# Patient Record
Sex: Female | Born: 1935 | Race: White | Hispanic: No | Smoking: Former smoker
Health system: Southern US, Community
[De-identification: ages and names within clinical notes are randomized; demographics above are authoritative.]

## PROBLEM LIST (undated history)

## (undated) DIAGNOSIS — F0390 Unspecified dementia without behavioral disturbance: Secondary | ICD-10-CM

---

## 2020-08-12 ENCOUNTER — Other Ambulatory Visit (HOSPITAL_COMMUNITY)
Admission: RE | Admit: 2020-08-12 | Discharge: 2020-08-12 | Disposition: A | Discharge status: Home / Self Care | Payer: Medicare Other | Source: Skilled Nursing Facility | Attending: Family Medicine | Admitting: Family Medicine

## 2020-08-12 DIAGNOSIS — N390 Urinary tract infection, site not specified: Secondary | ICD-10-CM | POA: Diagnosis present

## 2020-08-12 LAB — URINALYSIS, COMPLETE (UACMP) WITH MICROSCOPIC
Bacteria, UA: NONE SEEN
Bilirubin Urine: NEGATIVE
Glucose, UA: NEGATIVE mg/dL
Hgb urine dipstick: NEGATIVE
Ketones, ur: NEGATIVE mg/dL
Nitrite: NEGATIVE
Protein, ur: NEGATIVE mg/dL
Specific Gravity, Urine: 1.009 (ref 1.005–1.030)
pH: 5 (ref 5.0–8.0)

## 2020-08-13 LAB — URINE CULTURE

## 2021-01-11 ENCOUNTER — Other Ambulatory Visit (HOSPITAL_COMMUNITY)
Admission: RE | Admit: 2021-01-11 | Discharge: 2021-01-11 | Disposition: A | Discharge status: Home / Self Care | Payer: Medicare Other | Source: Other Acute Inpatient Hospital | Attending: Family Medicine | Admitting: Family Medicine

## 2021-01-11 DIAGNOSIS — N390 Urinary tract infection, site not specified: Secondary | ICD-10-CM | POA: Insufficient documentation

## 2021-01-11 LAB — URINALYSIS, ROUTINE W REFLEX MICROSCOPIC
Bilirubin Urine: NEGATIVE
Glucose, UA: NEGATIVE mg/dL
Hgb urine dipstick: NEGATIVE
Ketones, ur: NEGATIVE mg/dL
Nitrite: POSITIVE — AB
Protein, ur: 30 mg/dL — AB
Specific Gravity, Urine: 1.019 (ref 1.005–1.030)
pH: 9 — ABNORMAL HIGH (ref 5.0–8.0)

## 2021-01-13 LAB — URINE CULTURE: Culture: >=100000 — AB

## 2021-05-18 ENCOUNTER — Emergency Department (HOSPITAL_COMMUNITY): Payer: Medicare Other

## 2021-05-18 ENCOUNTER — Encounter (HOSPITAL_COMMUNITY): Payer: Self-pay | Admitting: Emergency Medicine

## 2021-05-18 ENCOUNTER — Observation Stay (HOSPITAL_COMMUNITY): Payer: Medicare Other

## 2021-05-18 ENCOUNTER — Observation Stay (HOSPITAL_COMMUNITY)
Admission: EM | Admit: 2021-05-18 | Discharge: 2021-05-19 | Disposition: A | Discharge status: Home / Self Care | Payer: Medicare Other | Attending: Internal Medicine | Admitting: Internal Medicine

## 2021-05-18 ENCOUNTER — Other Ambulatory Visit: Payer: Self-pay

## 2021-05-18 DIAGNOSIS — S0990XA Unspecified injury of head, initial encounter: Secondary | ICD-10-CM | POA: Diagnosis present

## 2021-05-18 DIAGNOSIS — R296 Repeated falls: Secondary | ICD-10-CM | POA: Diagnosis not present

## 2021-05-18 DIAGNOSIS — H9190 Unspecified hearing loss, unspecified ear: Secondary | ICD-10-CM

## 2021-05-18 DIAGNOSIS — Z87891 Personal history of nicotine dependence: Secondary | ICD-10-CM | POA: Insufficient documentation

## 2021-05-18 DIAGNOSIS — W19XXXA Unspecified fall, initial encounter: Secondary | ICD-10-CM | POA: Diagnosis present

## 2021-05-18 DIAGNOSIS — Z79899 Other long term (current) drug therapy: Secondary | ICD-10-CM | POA: Insufficient documentation

## 2021-05-18 DIAGNOSIS — I639 Cerebral infarction, unspecified: Secondary | ICD-10-CM | POA: Diagnosis present

## 2021-05-18 DIAGNOSIS — S0181XA Laceration without foreign body of other part of head, initial encounter: Principal | ICD-10-CM | POA: Diagnosis present

## 2021-05-18 DIAGNOSIS — J45909 Unspecified asthma, uncomplicated: Secondary | ICD-10-CM | POA: Diagnosis not present

## 2021-05-18 DIAGNOSIS — Z20822 Contact with and (suspected) exposure to covid-19: Secondary | ICD-10-CM | POA: Diagnosis not present

## 2021-05-18 DIAGNOSIS — F0390 Unspecified dementia without behavioral disturbance: Secondary | ICD-10-CM | POA: Diagnosis present

## 2021-05-18 HISTORY — DX: Unspecified dementia, unspecified severity, without behavioral disturbance, psychotic disturbance, mood disturbance, and anxiety: F03.90

## 2021-05-18 LAB — CBC WITH DIFFERENTIAL/PLATELET
Abs Immature Granulocytes: 0.03 10*3/uL (ref 0.00–0.07)
Basophils Absolute: 0.1 10*3/uL (ref 0.0–0.1)
Basophils Relative: 1 %
Eosinophils Absolute: 0.1 10*3/uL (ref 0.0–0.5)
Eosinophils Relative: 1 %
HCT: 39.3 % (ref 36.0–46.0)
Hemoglobin: 12.3 g/dL (ref 12.0–15.0)
Immature Granulocytes: 0 %
Lymphocytes Relative: 23 %
Lymphs Abs: 2.3 10*3/uL (ref 0.7–4.0)
MCH: 28.9 pg (ref 26.0–34.0)
MCHC: 31.3 g/dL (ref 30.0–36.0)
MCV: 92.3 fL (ref 80.0–100.0)
Monocytes Absolute: 0.9 10*3/uL (ref 0.1–1.0)
Monocytes Relative: 8 %
Neutro Abs: 6.9 10*3/uL (ref 1.7–7.7)
Neutrophils Relative %: 67 %
Platelets: 243 10*3/uL (ref 150–400)
RBC: 4.26 MIL/uL (ref 3.87–5.11)
RDW: 13.6 % (ref 11.5–15.5)
WBC: 10.3 10*3/uL (ref 4.0–10.5)
nRBC: 0 % (ref 0.0–0.2)

## 2021-05-18 LAB — URINALYSIS, ROUTINE W REFLEX MICROSCOPIC
Bilirubin Urine: NEGATIVE
Glucose, UA: NEGATIVE mg/dL
Hgb urine dipstick: NEGATIVE
Ketones, ur: NEGATIVE mg/dL
Leukocytes,Ua: NEGATIVE
Nitrite: NEGATIVE
Protein, ur: NEGATIVE mg/dL
Specific Gravity, Urine: 1.008 (ref 1.005–1.030)
pH: 7 (ref 5.0–8.0)

## 2021-05-18 LAB — RESP PANEL BY RT-PCR (FLU A&B, COVID) ARPGX2
Influenza A by PCR: NEGATIVE
Influenza B by PCR: NEGATIVE
SARS Coronavirus 2 by RT PCR: NEGATIVE

## 2021-05-18 LAB — COMPREHENSIVE METABOLIC PANEL
ALT: 21 U/L (ref 0–44)
AST: 20 U/L (ref 15–41)
Albumin: 3.7 g/dL (ref 3.5–5.0)
Alkaline Phosphatase: 68 U/L (ref 38–126)
Anion gap: 7 (ref 5–15)
BUN: 23 mg/dL (ref 8–23)
CO2: 27 mmol/L (ref 22–32)
Calcium: 8.8 mg/dL — ABNORMAL LOW (ref 8.9–10.3)
Chloride: 103 mmol/L (ref 98–111)
Creatinine, Ser: 1.09 mg/dL — ABNORMAL HIGH (ref 0.44–1.00)
GFR, Estimated: 47 mL/min — ABNORMAL LOW (ref >60)
Glucose, Bld: 106 mg/dL — ABNORMAL HIGH (ref 70–99)
Potassium: 4.2 mmol/L (ref 3.5–5.1)
Sodium: 137 mmol/L (ref 135–145)
Total Bilirubin: 0.5 mg/dL (ref 0.3–1.2)
Total Protein: 7.3 g/dL (ref 6.5–8.1)

## 2021-05-18 IMAGING — DX DG PELVIS 1-2V
2 series · 2 of 2 positions shown · non-contrast
Comparison: None.

CLINICAL DATA: Fall.

EXAM:
PELVIS - 1-2 VIEW

[pelvis ap (1 of 2)]
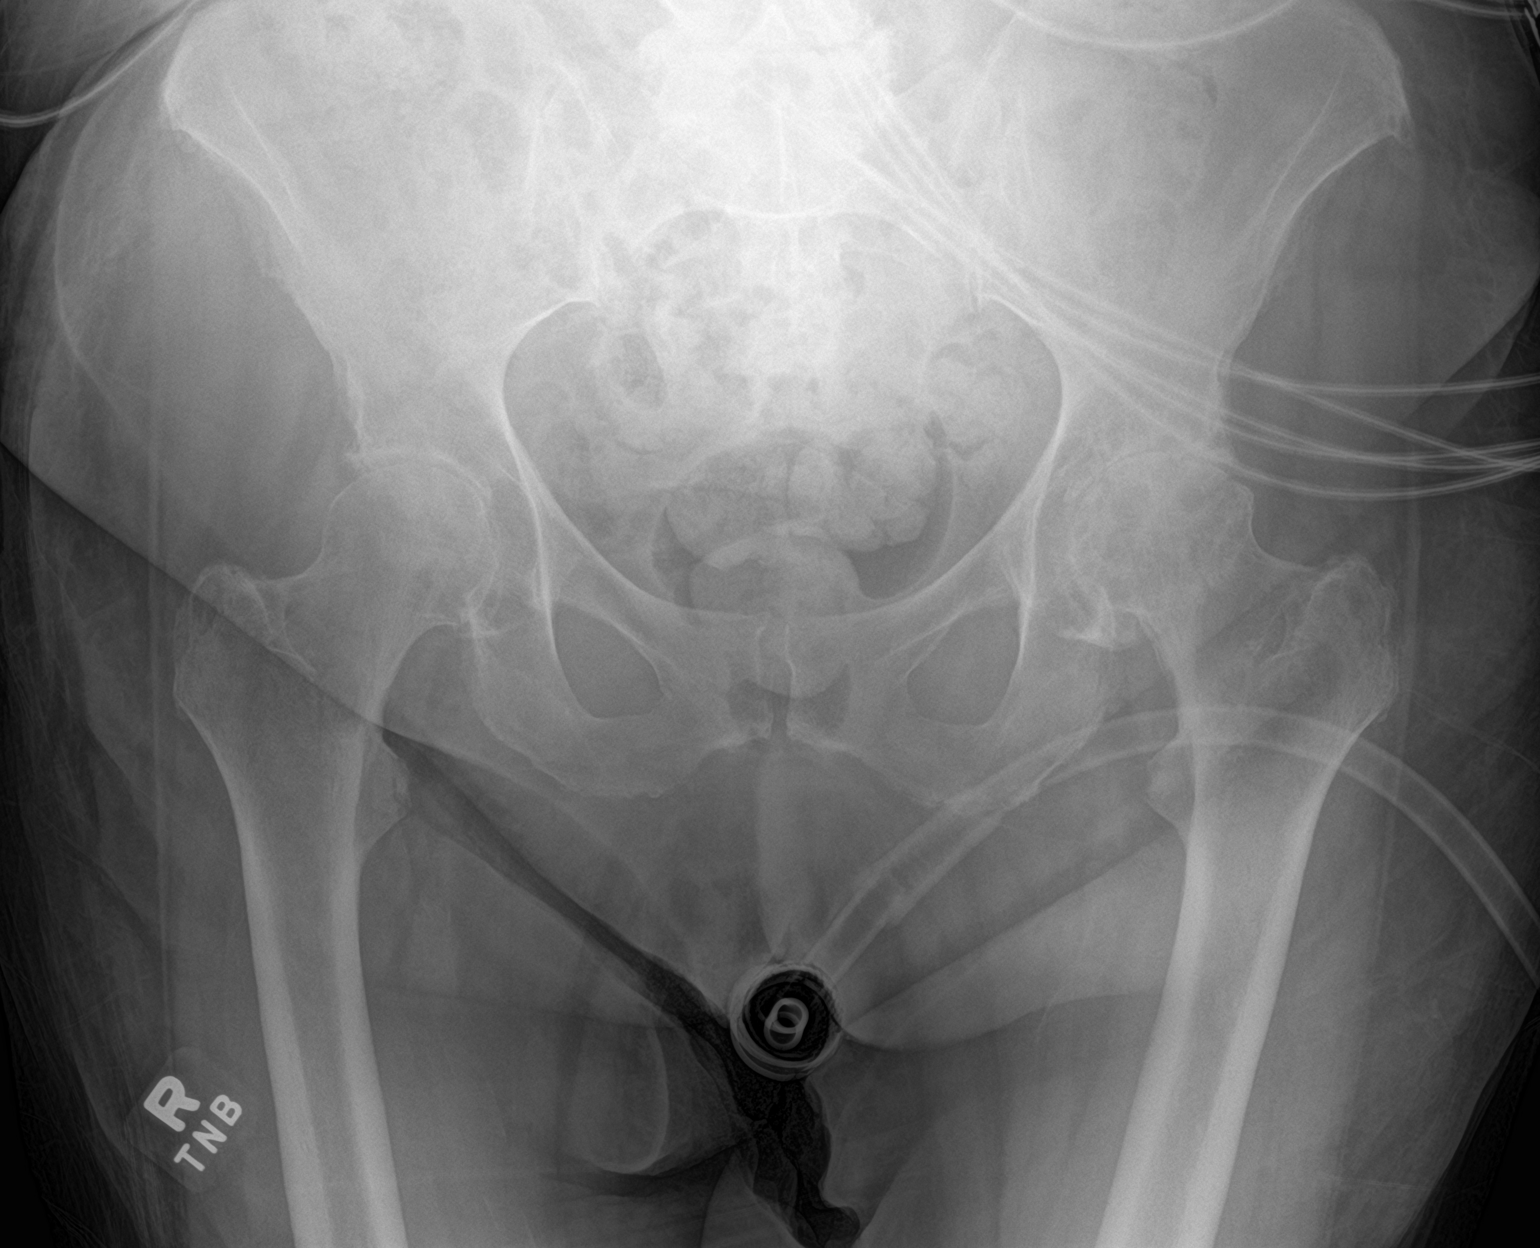

[pelvis ap (2 of 2)]
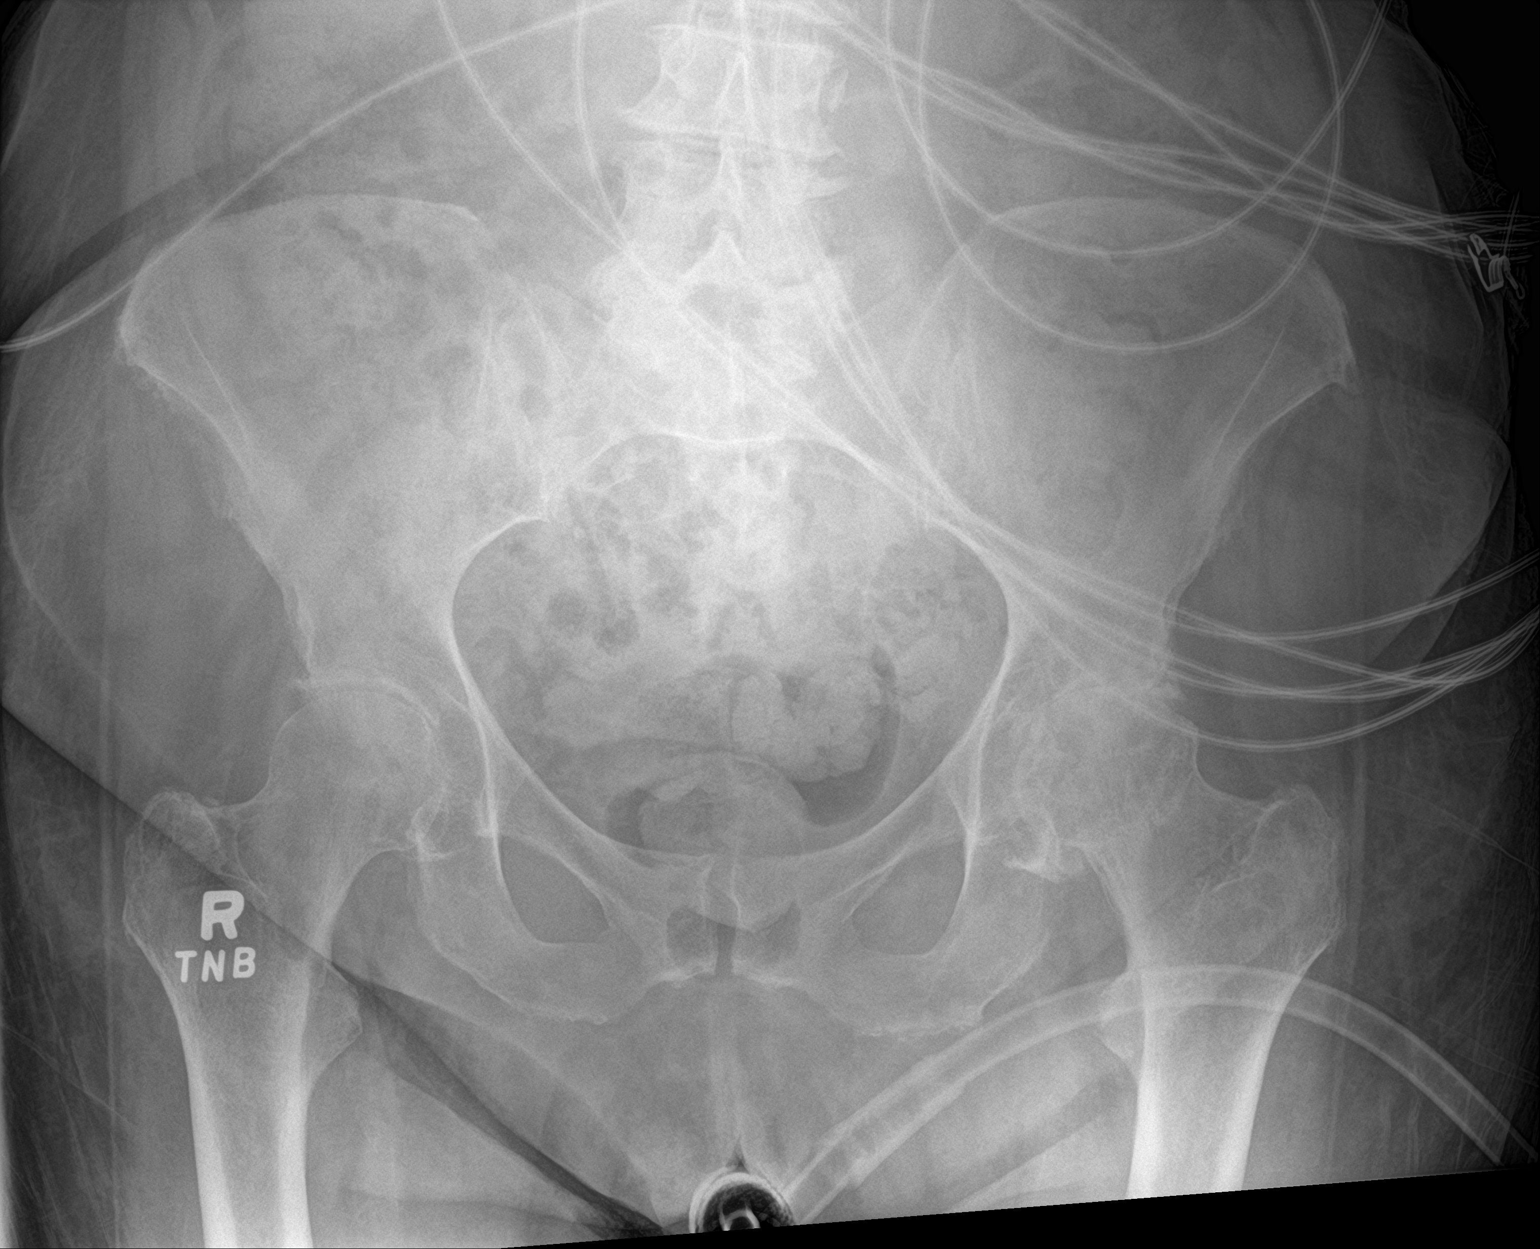

[2 of 2 positions shown; findings below may reference images not displayed]

FINDINGS: There is no evidence of pelvic fracture or diastasis. No pelvic bone
lesions are seen. Moderate to severe left and mild-to-moderate right
hip osteoarthritis.
IMPRESSION: 1. No acute osseous abnormality.

## 2021-05-18 IMAGING — MR MR MRA HEAD W/O CM
1 series · 48 of 48 positions shown · non-contrast
Comparison: CT head/maxillofacial performed earlier today
[DATE].

CLINICAL DATA: Neuro deficit, acute, stroke suspected. Additional
history provided: Fall, forehead laceration, altered mental status.

EXAM:
MRI HEAD WITHOUT CONTRAST
MRA HEAD WITHOUT CONTRAST
TECHNIQUE: Multiplanar, multi-echo pulse sequences of the brain and surrounding
structures were acquired without intravenous contrast. Angiographic
images of the Circle of Willis were acquired using MRA technique
without intravenous contrast.

[Series 100: TOF · axial · 0.5mm · 0.76mm/px · z∈[-53,+24]mm · 48 of 168 slices shown]
[im 1/168]
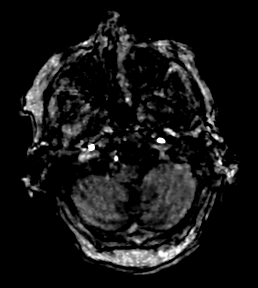
[im 4/168]
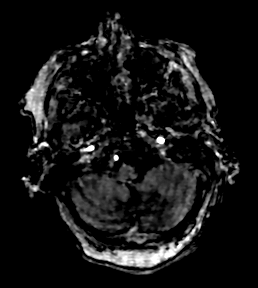
[im 8/168]
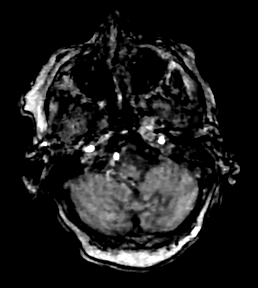
[im 11/168]
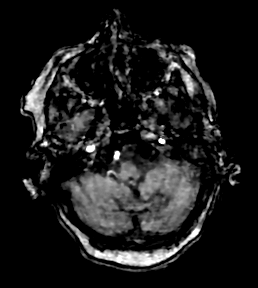
[im 15/168]
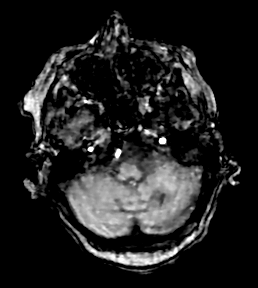
[im 18/168]
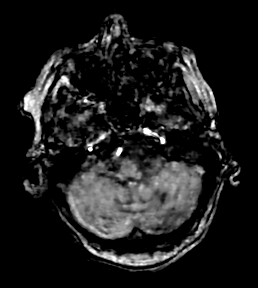
[im 22/168]
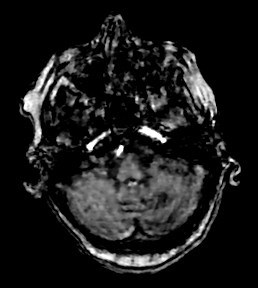
[im 25/168]
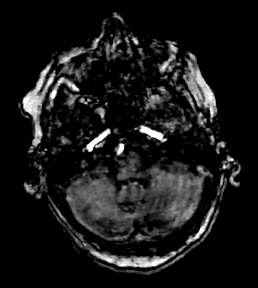
[im 29/168]
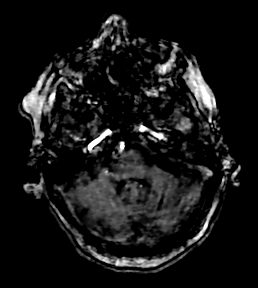
[im 32/168]
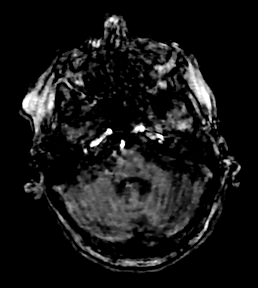
[im 36/168]
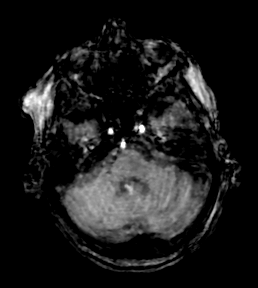
[im 40/168]
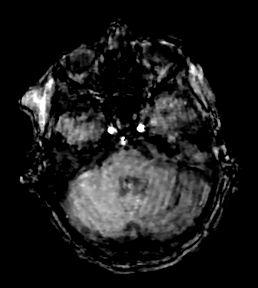
[im 43/168]
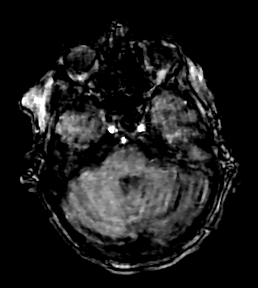
[im 47/168]
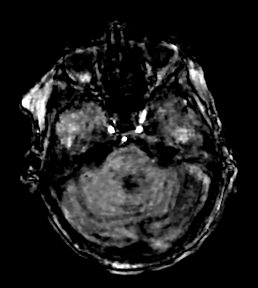
[im 50/168]
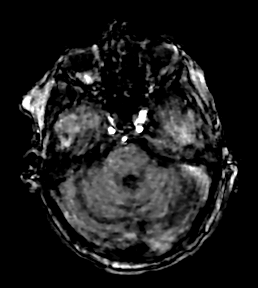
[im 54/168]
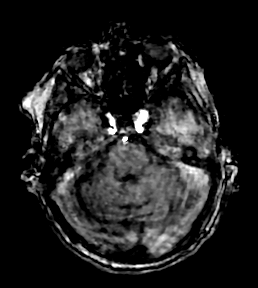
[im 57/168]
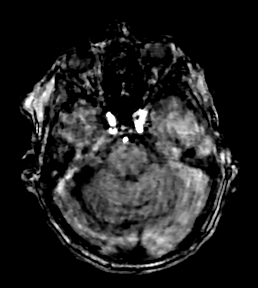
[im 61/168]
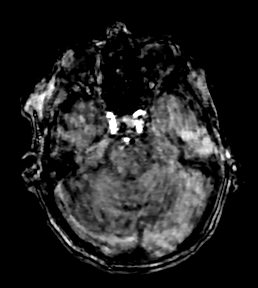
[im 64/168]
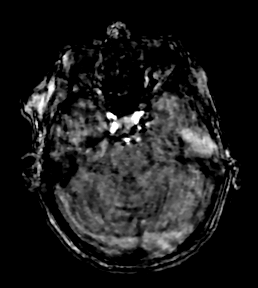
[im 68/168]
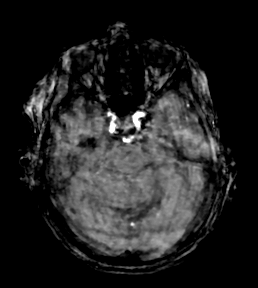
[im 72/168]
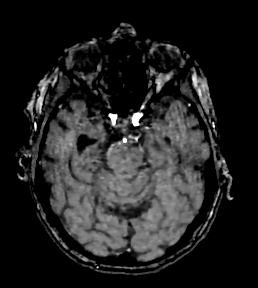
[im 75/168]
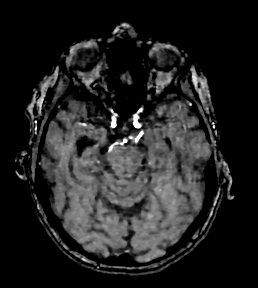
[im 79/168]
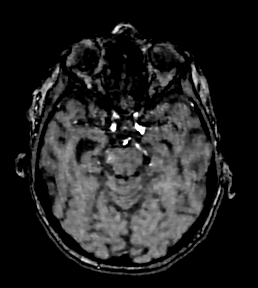
[im 82/168]
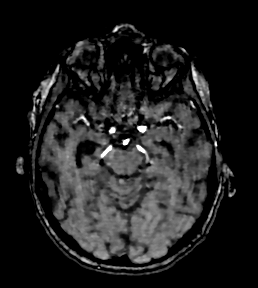
[im 86/168]
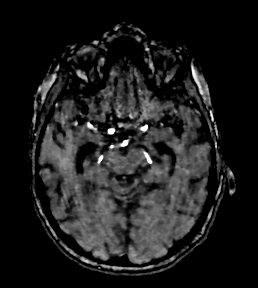
[im 89/168]
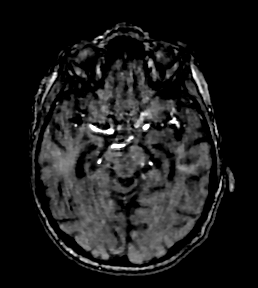
[im 93/168]
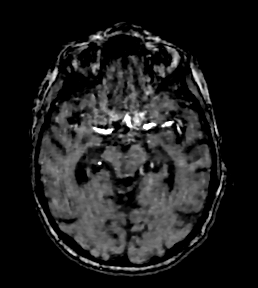
[im 96/168]
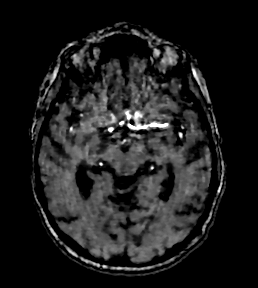
[im 100/168]
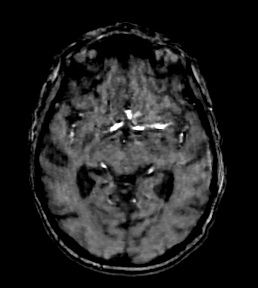
[im 104/168]
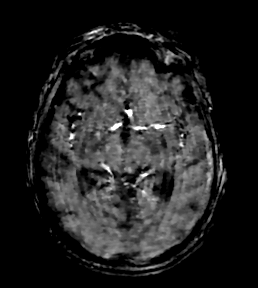
[im 107/168]
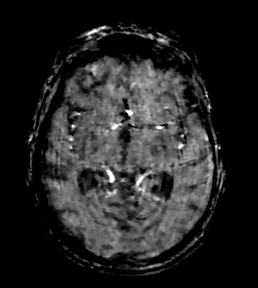
[im 111/168]
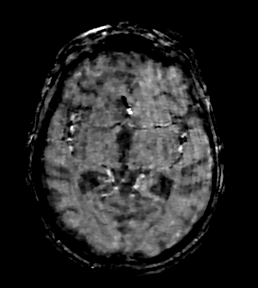
[im 114/168]
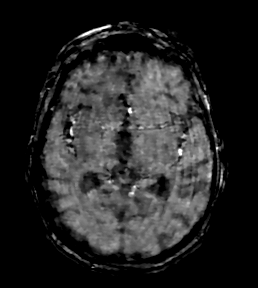
[im 118/168]
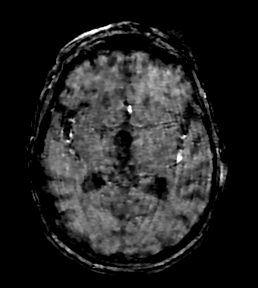
[im 121/168]
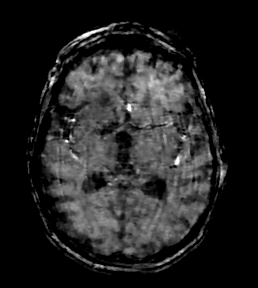
[im 125/168]
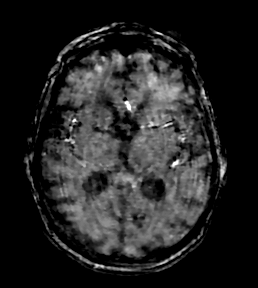
[im 128/168]
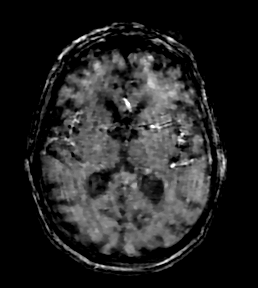
[im 132/168]
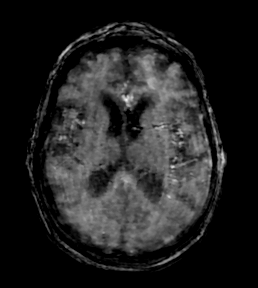
[im 136/168]
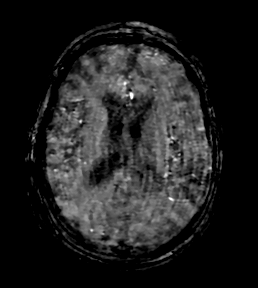
[im 139/168]
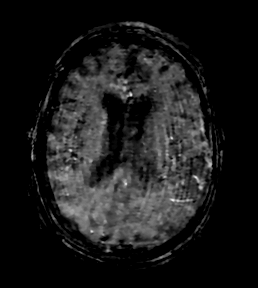
[im 143/168]
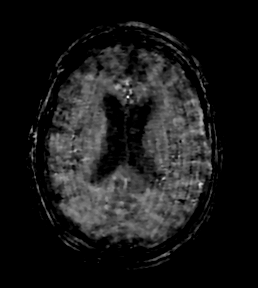
[im 146/168]
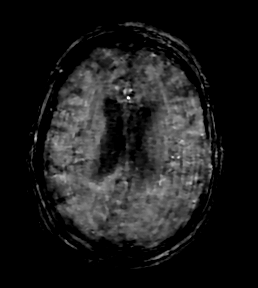
[im 150/168]
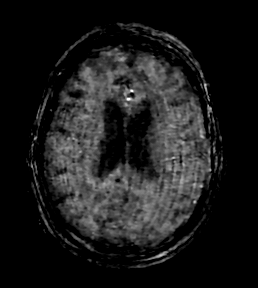
[im 153/168]
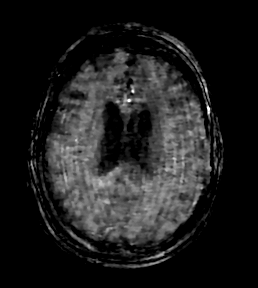
[im 157/168]
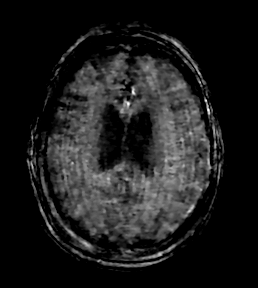
[im 160/168]
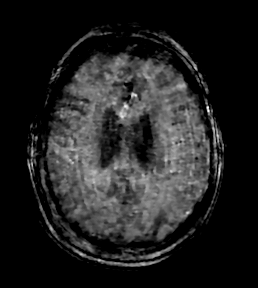
[im 164/168]
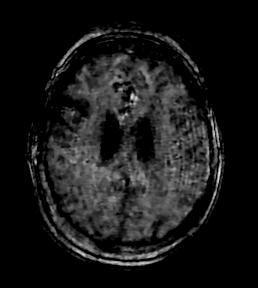
[im 168/168]
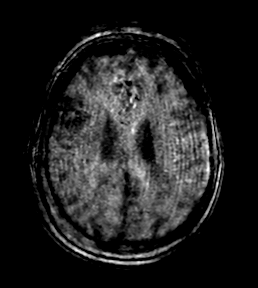

[48 of 48 positions shown; findings below may reference images not displayed]

FINDINGS: MRI HEAD FINDINGS

Brain:

The patient was unable to tolerate the full examination. As a
result, only axial and coronal diffusion-weighted sequences, a
sagittal T1 weighted sequence, axial T2 TSE sequence and an axial T2
GRE sequence could be obtained. The acquired imaging is motion
degraded. Most notably, there is moderate motion degradation of the
coronal diffusion-weighted sequence, severe motion degradation of
the sagittal T1 weighted sequence, severe motion degradation of the
axial T2 TSE sequence and severe motion degradation of the axial T2
GRE sequence.

Mild generalized cerebral and cerebellar atrophy.

There is a focus of cortical/subcortical diffusion weighted signal
abnormality within the right superior frontal gyrus measuring 3.8 x
0.9 cm in transaxial dimensions. There are some regions of
corresponding hypointense signal on the ADC map, and this finding is
consistent with an acute/subacute infarct.

Incompletely assessed multifocal T2/FLAIR hyperintensity within the
cerebral white matter and pons, nonspecific but compatible with
chronic small vessel ischemic disease.

Chronic lacunar infarct within the right thalamus.

Chronic infarcts within the bilateral cerebellar hemispheres.

Within the limitations of motion degradation, no intracranial mass,
chronic intracranial blood products or extra-axial fluid collection
is identified. No midline shift.

Vascular: Flow voids poorly assessed due to the degree of motion
degradation.

Skull and upper cervical spine: Within limitations of motion
degradation, no focal suspicious marrow lesion is identified.

Sinuses/Orbits: Within limitations of motion degradation, no acute
orbital abnormality or significant paranasal sinus disease is
identified.

MRA HEAD FINDINGS

The examination is moderate to severely motion degraded. This
significantly limits evaluation for vessel occlusions. This also
precludes adequate evaluation for intracranial arterial stenoses and
for aneurysms.

Anterior circulation:

The intracranial internal carotid arteries are patent. The M1 middle
cerebral arteries are patent. No M2 proximal branch occlusion is
identified. The anterior cerebral arteries are patent proximally.
However, motion degradation precludes adequate evaluation of the
anterior cerebral arteries beginning at the A2/A3 junction.

Posterior circulation:

The intracranial left vertebral artery is poorly delineated, and
patency of this vessel cannot be established. The intracranial right
vertebral artery is patent. The basilar artery is patent. The
posterior cerebral arteries are patent proximally. Atherosclerotic
irregularity of both vessels. Most notably, there are sites of
moderate stenosis within the P2 left PCA. Motion degradation
precludes adequate evaluation of the posterior cerebral arteries
beyond the P3 segment level. A left posterior communicating artery
is present. The right posterior communicating artery is hypoplastic
or absent.

Anatomic variants: As described
IMPRESSION: MRI brain:

1. Prematurely terminated, significantly motion degraded and limited
examination as described.
2. 3.8 x 0.9 cm acute/subacute cortical and subcortical infarct
within the right superior frontal gyrus (right ACA vascular
territory).
3. Chronic right thalamic lacunar infarct.
4. Incompletely assessed chronic small vessel ischemic disease
within the cerebral white matter and pons.
5. Chronic infarcts within the bilateral cerebellar hemispheres.
6. Mild generalized parenchymal atrophy.

MRA head:

1. The examination is moderate to severely motion degraded. This
significantly limits evaluation for vessel occlusions. This also
precludes adequate evaluation for intracranial arterial stenoses and
aneurysms.
2. No definite anterior circulation proximal large vessel occlusion
is identified. However, motion degradation precludes adequate
evaluation of the anterior cerebral arteries beginning at the A2/A3
junction. CT angiography may be obtained for further evaluation, as
clinically warranted.
3. The intracranial left vertebral artery is poorly delineated, and
patency of this vessel cannot be established.
4. Sites of moderate stenosis within the P2 left PCA.

## 2021-05-18 IMAGING — MR MR HEAD W/O CM
7 series · 48 of 48 positions shown · non-contrast
Comparison: CT head/maxillofacial performed earlier today
[DATE].

CLINICAL DATA: Neuro deficit, acute, stroke suspected. Additional
history provided: Fall, forehead laceration, altered mental status.

EXAM:
MRI HEAD WITHOUT CONTRAST
MRA HEAD WITHOUT CONTRAST
TECHNIQUE: Multiplanar, multi-echo pulse sequences of the brain and surrounding
structures were acquired without intravenous contrast. Angiographic
images of the Circle of Willis were acquired using MRA technique
without intravenous contrast.

[Series 5: DWI · axial · 4.0mm · 0.88mm/px · z∈[-52,+86]mm · 9 of 36 slices shown (1 of 4)]
[im 1/36]
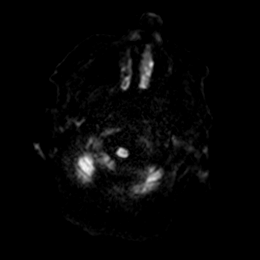
[im 5/36]
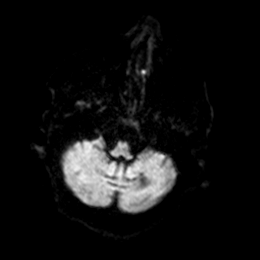
[im 9/36]
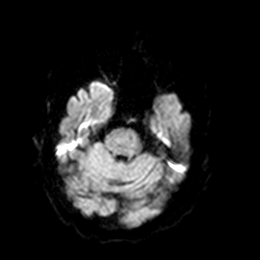
[im 14/36]
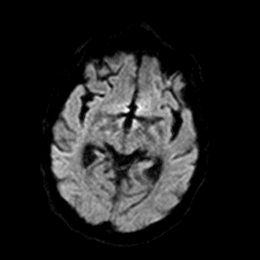
[im 18/36]
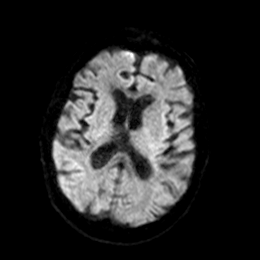
[im 22/36]
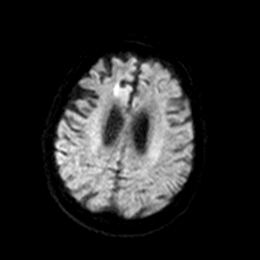
[im 27/36]
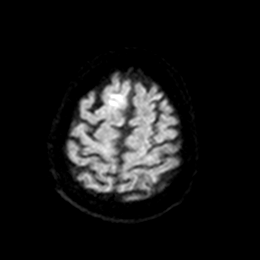
[im 31/36]
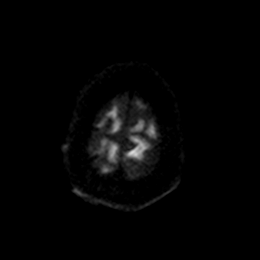
[im 36/36]
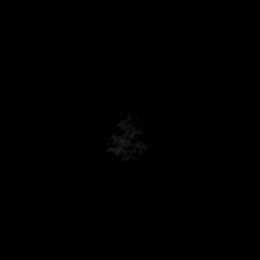

[Series 6: DWI · axial · 4.0mm · 0.88mm/px · z∈[-52,+86]mm · 9 of 36 slices shown (2 of 4)]
[im 1/36]
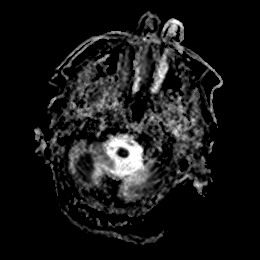
[im 5/36]
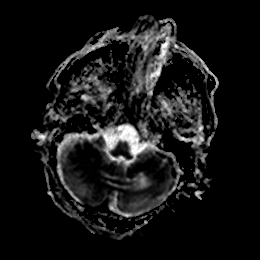
[im 9/36]
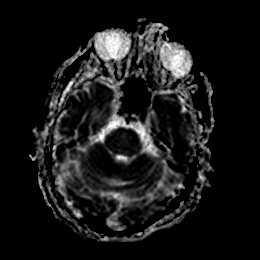
[im 14/36]
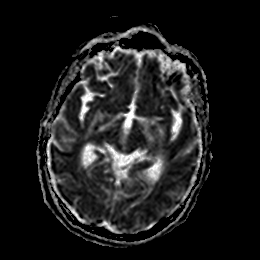
[im 18/36]
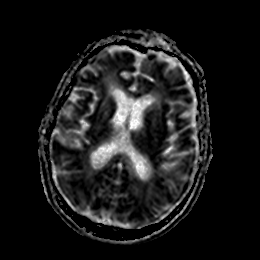
[im 22/36]
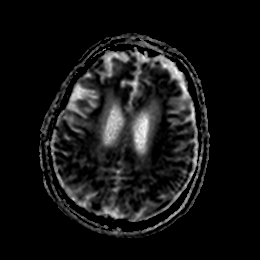
[im 27/36]
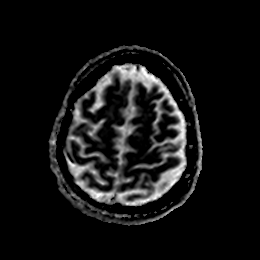
[im 31/36]
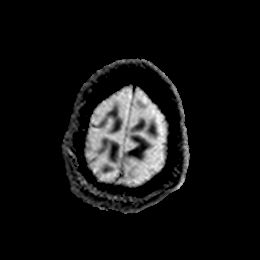
[im 36/36]
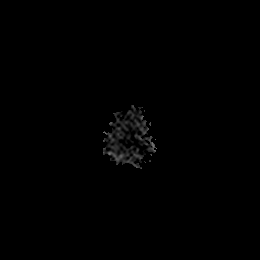

[Series 7: DWI · coronal · 4.0mm · 0.88mm/px · 7 of 32 slices shown (3 of 4)]
[im 1/32]
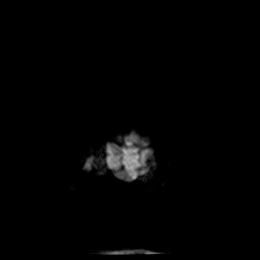
[im 6/32]
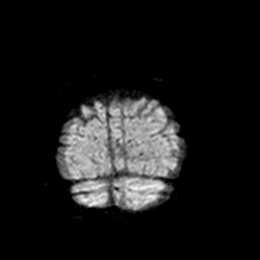
[im 11/32]
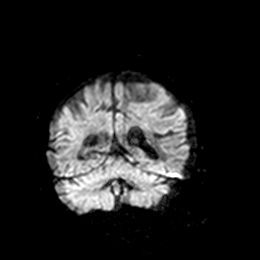
[im 16/32]
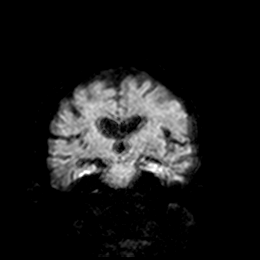
[im 21/32]
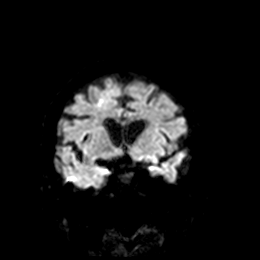
[im 26/32]
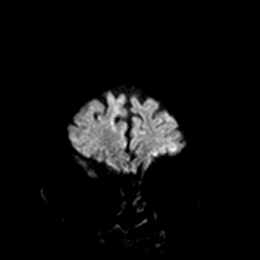
[im 32/32]
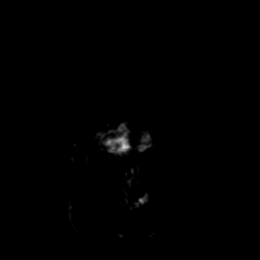

[Series 8: DWI · coronal · 4.0mm · 0.88mm/px · 7 of 32 slices shown (4 of 4)]
[im 1/32]
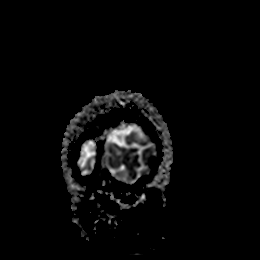
[im 6/32]
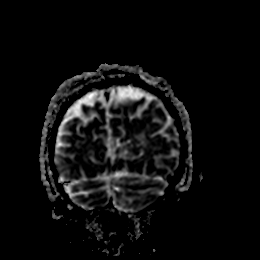
[im 11/32]
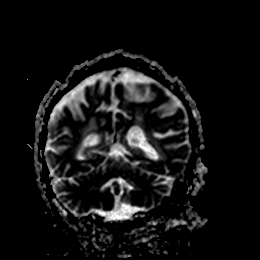
[im 16/32]
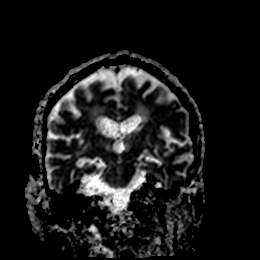
[im 21/32]
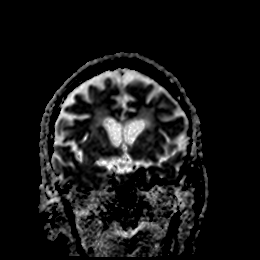
[im 26/32]
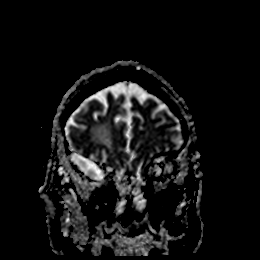
[im 32/32]
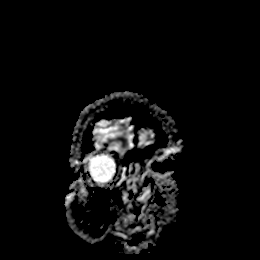

[Series 14: T1 · sagittal · 5.0mm · 0.80mm/px · 5 of 23 slices shown]
[im 1/23]
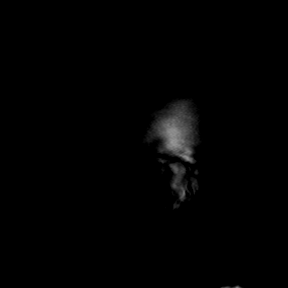
[im 6/23]
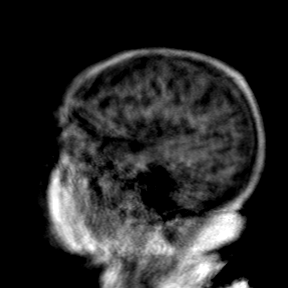
[im 12/23]
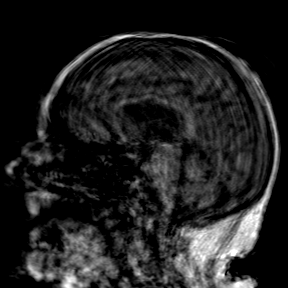
[im 17/23]
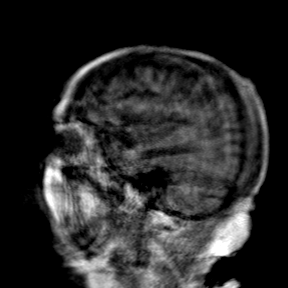
[im 23/23]
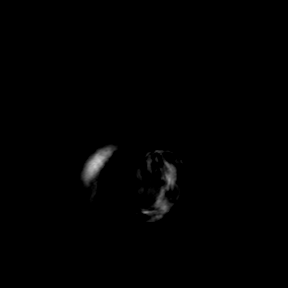

[Series 15: T2 · axial · 5.0mm · 0.72mm/px · z∈[-59,+93]mm · 5 of 23 slices shown]
[im 1/23]
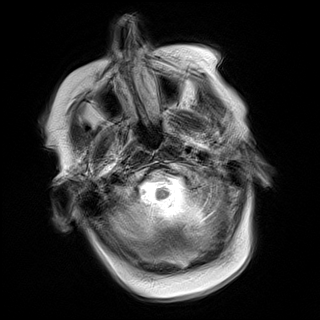
[im 6/23]
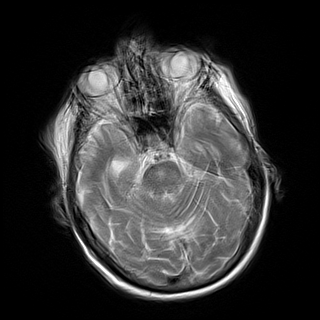
[im 12/23]
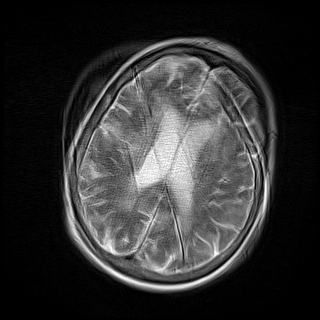
[im 17/23]
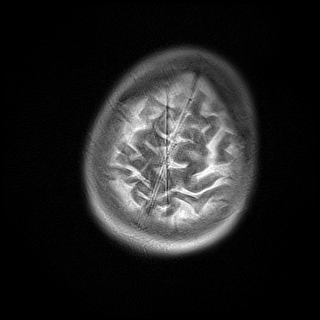
[im 23/23]
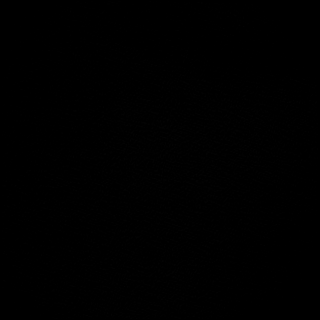

[Series 16: ax hemo · axial · 5.0mm · 0.86mm/px · z∈[-57,+85]mm · 6 of 25 slices shown]
[im 1/25]
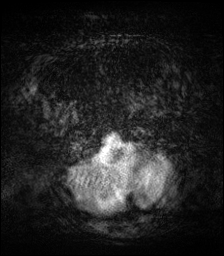
[im 5/25]
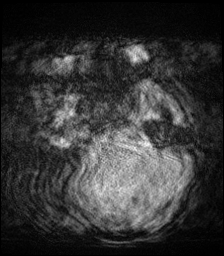
[im 10/25]
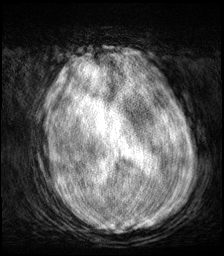
[im 15/25]
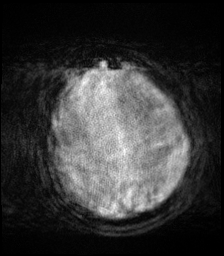
[im 20/25]
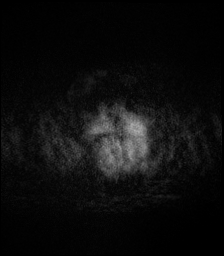
[im 25/25]
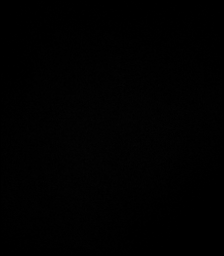

[48 of 48 positions shown; findings below may reference images not displayed]

FINDINGS: MRI HEAD FINDINGS

Brain:

The patient was unable to tolerate the full examination. As a
result, only axial and coronal diffusion-weighted sequences, a
sagittal T1 weighted sequence, axial T2 TSE sequence and an axial T2
GRE sequence could be obtained. The acquired imaging is motion
degraded. Most notably, there is moderate motion degradation of the
coronal diffusion-weighted sequence, severe motion degradation of
the sagittal T1 weighted sequence, severe motion degradation of the
axial T2 TSE sequence and severe motion degradation of the axial T2
GRE sequence.

Mild generalized cerebral and cerebellar atrophy.

There is a focus of cortical/subcortical diffusion weighted signal
abnormality within the right superior frontal gyrus measuring 3.8 x
0.9 cm in transaxial dimensions. There are some regions of
corresponding hypointense signal on the ADC map, and this finding is
consistent with an acute/subacute infarct.

Incompletely assessed multifocal T2/FLAIR hyperintensity within the
cerebral white matter and pons, nonspecific but compatible with
chronic small vessel ischemic disease.

Chronic lacunar infarct within the right thalamus.

Chronic infarcts within the bilateral cerebellar hemispheres.

Within the limitations of motion degradation, no intracranial mass,
chronic intracranial blood products or extra-axial fluid collection
is identified. No midline shift.

Vascular: Flow voids poorly assessed due to the degree of motion
degradation.

Skull and upper cervical spine: Within limitations of motion
degradation, no focal suspicious marrow lesion is identified.

Sinuses/Orbits: Within limitations of motion degradation, no acute
orbital abnormality or significant paranasal sinus disease is
identified.

MRA HEAD FINDINGS

The examination is moderate to severely motion degraded. This
significantly limits evaluation for vessel occlusions. This also
precludes adequate evaluation for intracranial arterial stenoses and
for aneurysms.

Anterior circulation:

The intracranial internal carotid arteries are patent. The M1 middle
cerebral arteries are patent. No M2 proximal branch occlusion is
identified. The anterior cerebral arteries are patent proximally.
However, motion degradation precludes adequate evaluation of the
anterior cerebral arteries beginning at the A2/A3 junction.

Posterior circulation:

The intracranial left vertebral artery is poorly delineated, and
patency of this vessel cannot be established. The intracranial right
vertebral artery is patent. The basilar artery is patent. The
posterior cerebral arteries are patent proximally. Atherosclerotic
irregularity of both vessels. Most notably, there are sites of
moderate stenosis within the P2 left PCA. Motion degradation
precludes adequate evaluation of the posterior cerebral arteries
beyond the P3 segment level. A left posterior communicating artery
is present. The right posterior communicating artery is hypoplastic
or absent.

Anatomic variants: As described
IMPRESSION: MRI brain:

1. Prematurely terminated, significantly motion degraded and limited
examination as described.
2. 3.8 x 0.9 cm acute/subacute cortical and subcortical infarct
within the right superior frontal gyrus (right ACA vascular
territory).
3. Chronic right thalamic lacunar infarct.
4. Incompletely assessed chronic small vessel ischemic disease
within the cerebral white matter and pons.
5. Chronic infarcts within the bilateral cerebellar hemispheres.
6. Mild generalized parenchymal atrophy.

MRA head:

1. The examination is moderate to severely motion degraded. This
significantly limits evaluation for vessel occlusions. This also
precludes adequate evaluation for intracranial arterial stenoses and
aneurysms.
2. No definite anterior circulation proximal large vessel occlusion
is identified. However, motion degradation precludes adequate
evaluation of the anterior cerebral arteries beginning at the A2/A3
junction. CT angiography may be obtained for further evaluation, as
clinically warranted.
3. The intracranial left vertebral artery is poorly delineated, and
patency of this vessel cannot be established.
4. Sites of moderate stenosis within the P2 left PCA.

## 2021-05-18 IMAGING — CT CT MAXILLOFACIAL W/O CM
1 series · 15 of 30 positions shown, 19 images · non-contrast
Comparison: None.

CLINICAL DATA: Fall.

EXAM:
CT HEAD WITHOUT CONTRAST
CT MAXILLOFACIAL WITHOUT CONTRAST
CT CERVICAL SPINE WITHOUT CONTRAST
TECHNIQUE: Multidetector CT imaging of the head, cervical spine, and
maxillofacial structures were performed using the standard protocol
without intravenous contrast. Multiplanar CT image reconstructions
of the cervical spine and maxillofacial structures were also
generated.

[Series 2: max soft · axial · 0.35mm/px · z∈[-17,+113]mm · 15 of 71 slices shown, 19 images]
[im 3/71  brain]
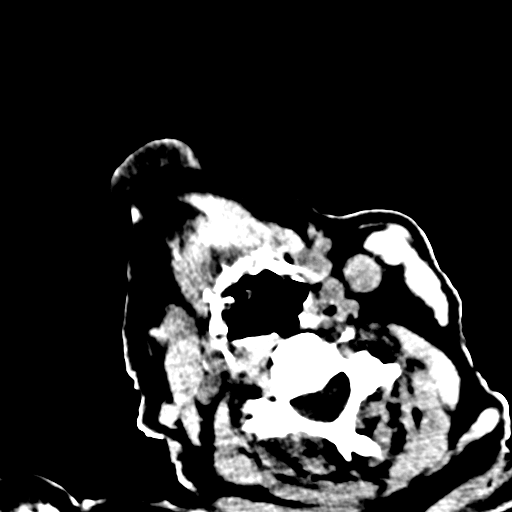
[im 3/71  bone]
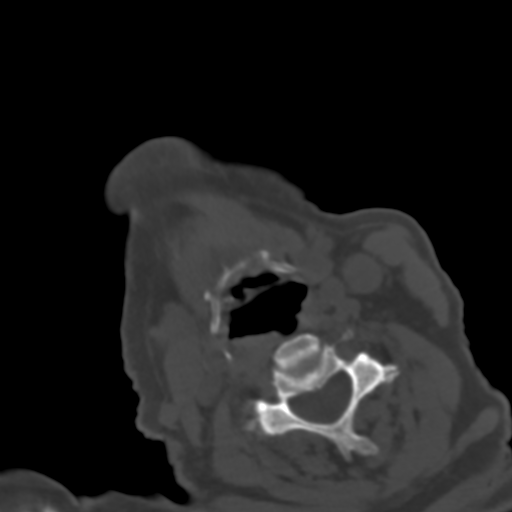
[im 8/71  bone]
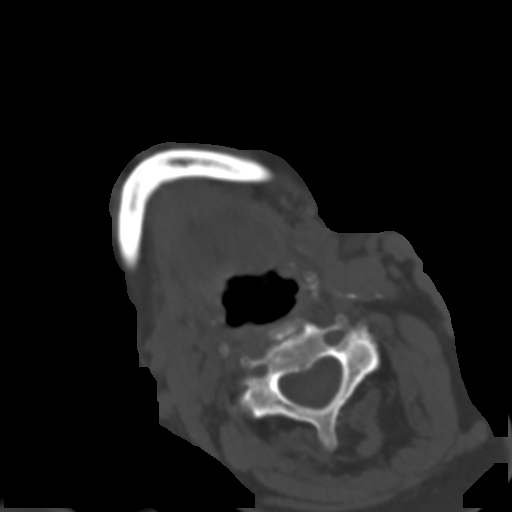
[im 13/71  bone]
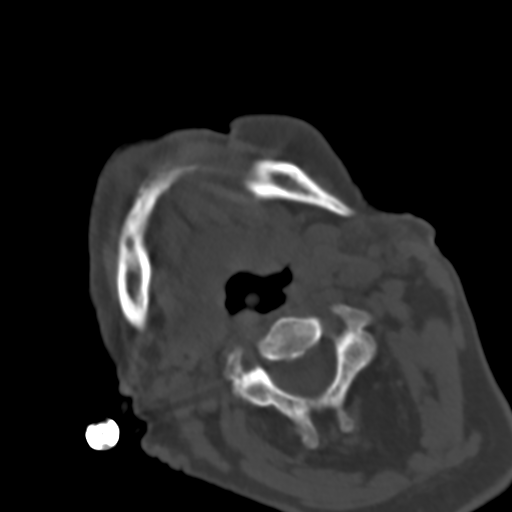
[im 17/71  bone]
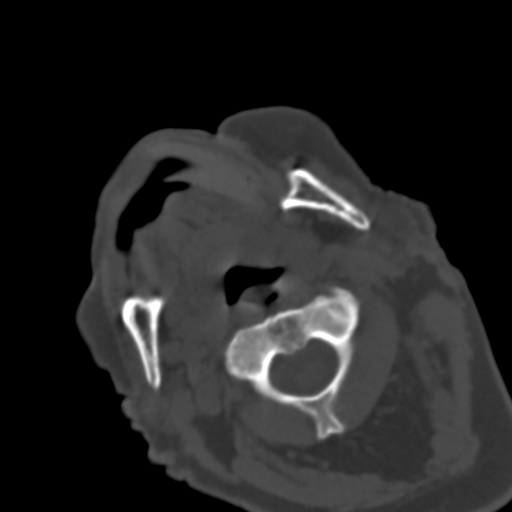
[im 22/71  brain]
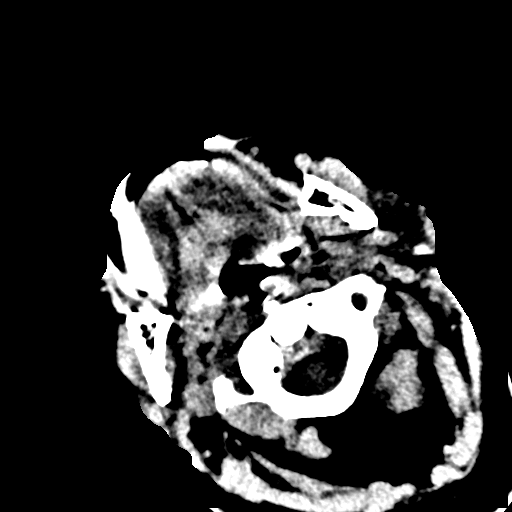
[im 22/71  bone]
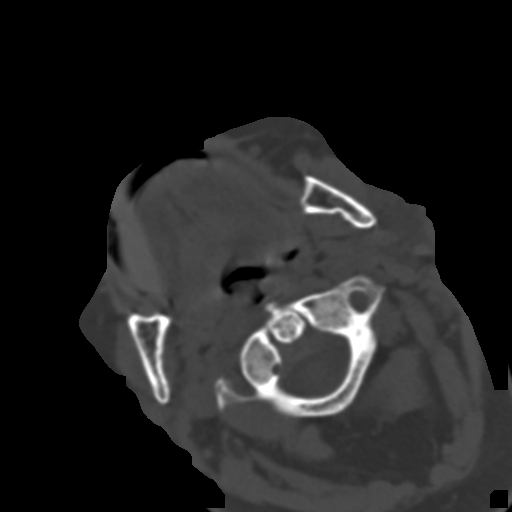
[im 27/71  bone]
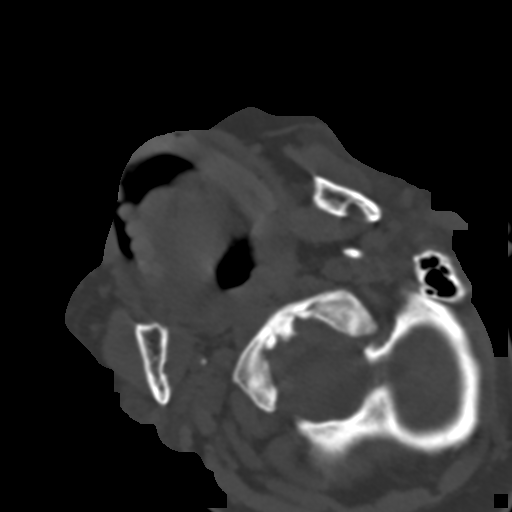
[im 32/71  bone]
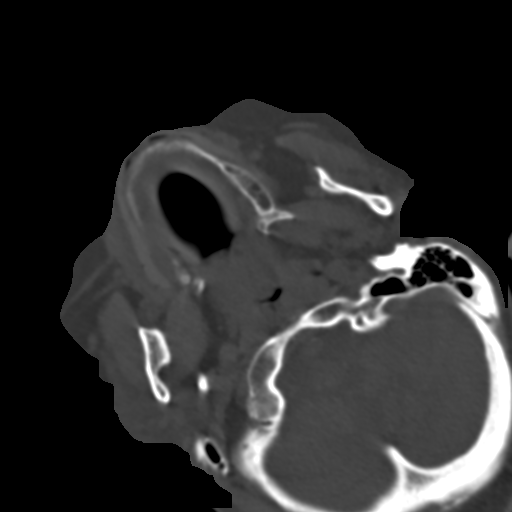
[im 37/71  bone]
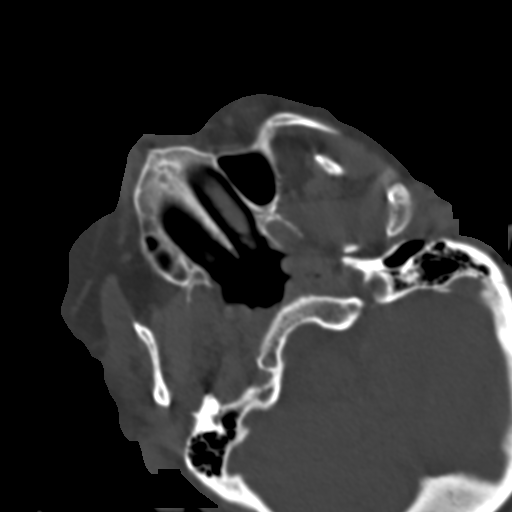
[im 39/71  brain]
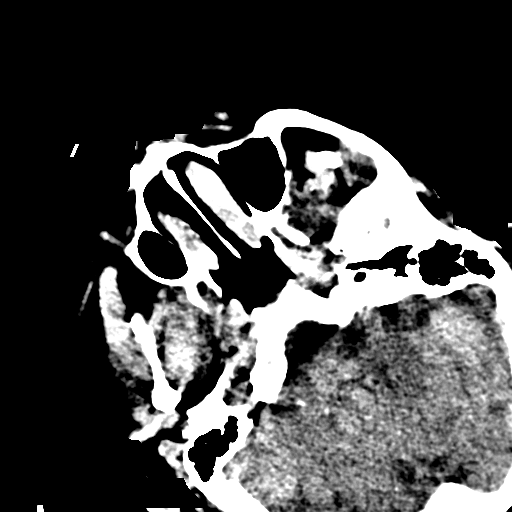
[im 39/71  bone]
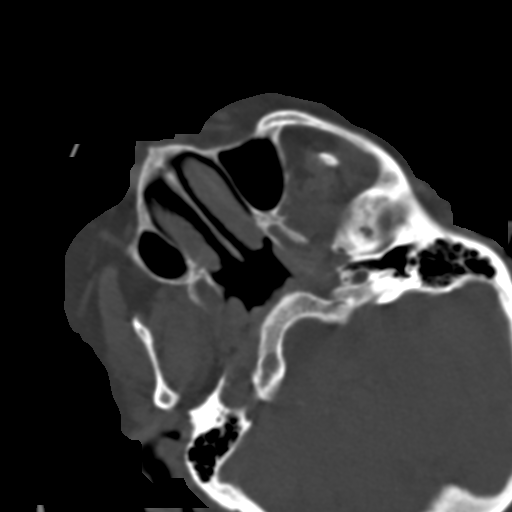
[im 44/71  bone]
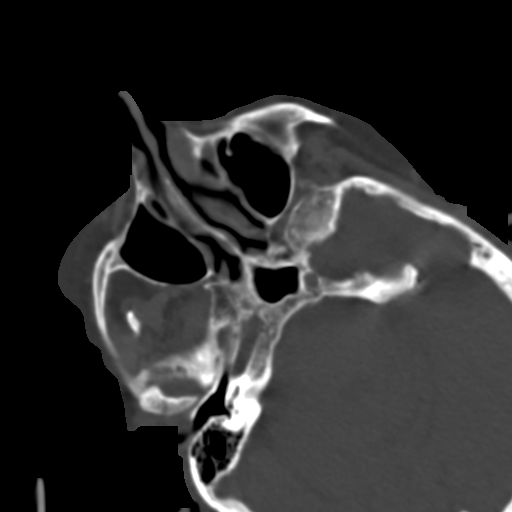
[im 49/71  bone]
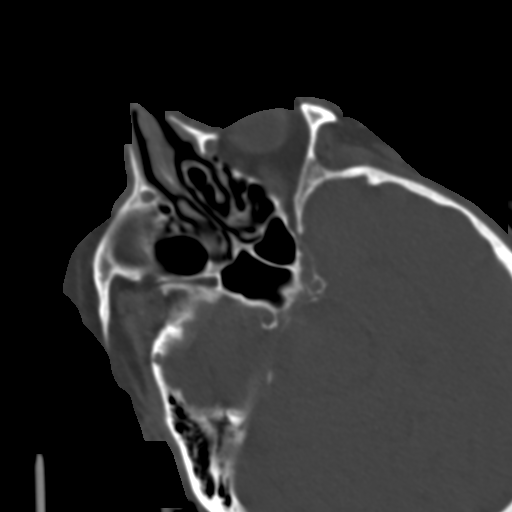
[im 54/71  bone]
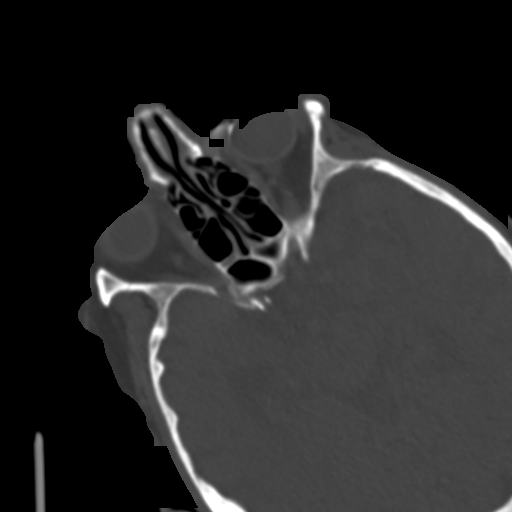
[im 58/71  brain]
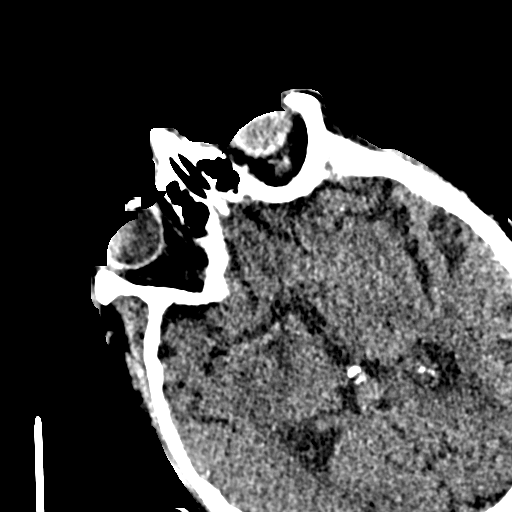
[im 58/71  bone]
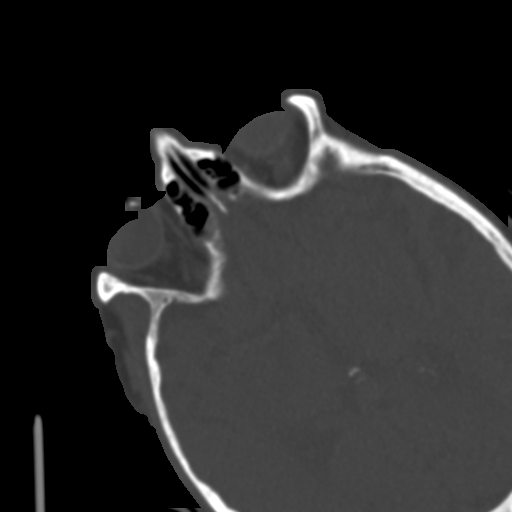
[im 63/71  bone]
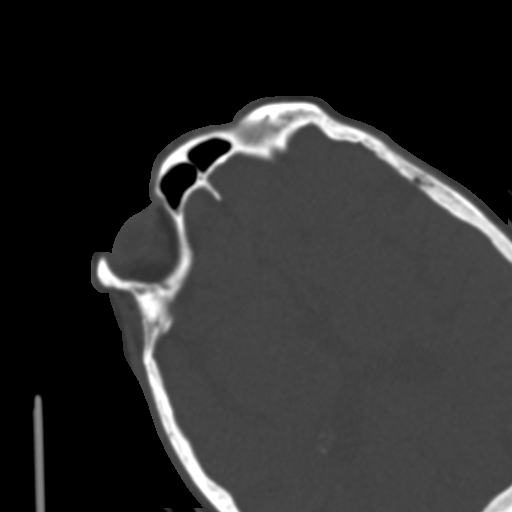
[im 68/71  bone]
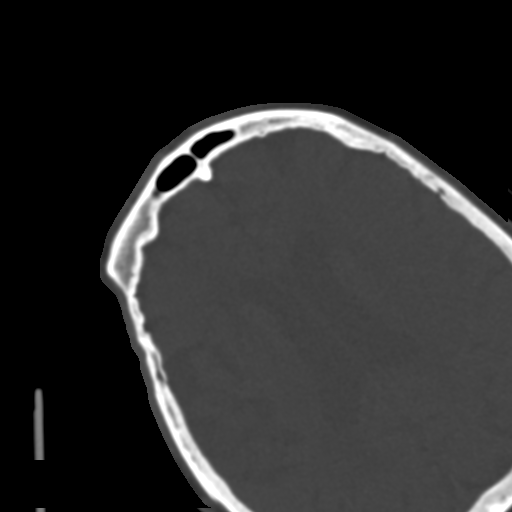

[15 of 30 positions shown; findings below may reference images not displayed]

FINDINGS: CT HEAD FINDINGS

Brain: Loss of the normal gray-white matter differentiation in the
right superior frontal gyrus. No evidence of hemorrhage,
hydrocephalus, extra-axial collection or mass lesion/mass effect.
Old left cerebellar and right thalamus infarcts. Moderate cerebral
atrophy and chronic microvascular ischemic changes.

Vascular: Calcified atherosclerosis at the skullbase. No hyperdense
vessel.

Skull: Normal. Negative for fracture or focal lesion.

Other: None.

CT MAXILLOFACIAL FINDINGS

Osseous: No fracture or mandibular dislocation. No destructive
process.

Orbits: Negative. No traumatic or inflammatory finding.

Sinuses: Clear.

Soft tissues: Negative.

CT CERVICAL SPINE FINDINGS

Alignment: No traumatic malalignment. Mild facet mediated
anterolisthesis at C3-C4 and C7-T1.

Skull base and vertebrae: No acute fracture. No primary bone lesion
or focal pathologic process.

Soft tissues and spinal canal: No prevertebral fluid or swelling. No
visible canal hematoma.

Disc levels: Advanced multilevel disc height loss and facet
uncovertebral hypertrophy.

Upper chest: Biapical pleuroparenchymal scarring.

Other: None.
IMPRESSION: 1. Loss of the normal gray-white matter differentiation in the right
superior frontal gyrus, concerning for acute to subacute infarct. No
acute intracranial hemorrhage.
2. Old left cerebellar and right thalamus infarcts.
3. No acute maxillofacial fracture.
4. No acute cervical spine fracture or traumatic listhesis. Advanced
multilevel degenerative changes.

## 2021-05-18 IMAGING — CT CT CERVICAL SPINE W/O CM
3 series · 13 of 33 positions shown, 16 images · non-contrast
Comparison: None.

CLINICAL DATA: Fall.

EXAM:
CT HEAD WITHOUT CONTRAST
CT MAXILLOFACIAL WITHOUT CONTRAST
CT CERVICAL SPINE WITHOUT CONTRAST
TECHNIQUE: Multidetector CT imaging of the head, cervical spine, and
maxillofacial structures were performed using the standard protocol
without intravenous contrast. Multiplanar CT image reconstructions
of the cervical spine and maxillofacial structures were also
generated.

[Series 4: c spine soft · axial · 0.29mm/px · z∈[-71,+53]mm · 5 of 90 slices shown, 7 images]
[im 14/90  soft-tissue]
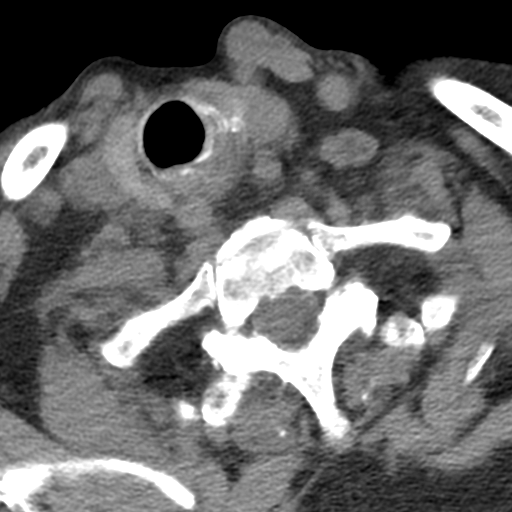
[im 14/90  bone]
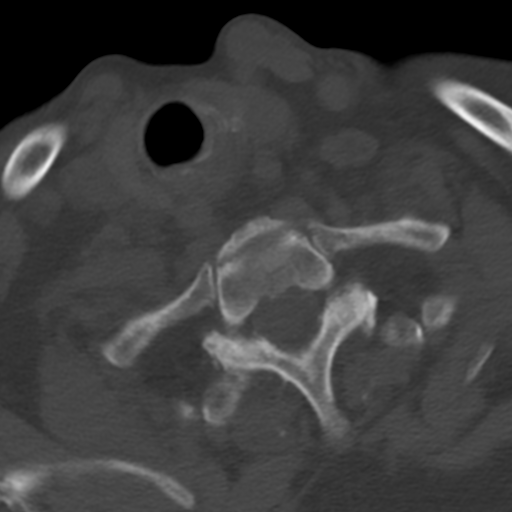
[im 28/90  bone]
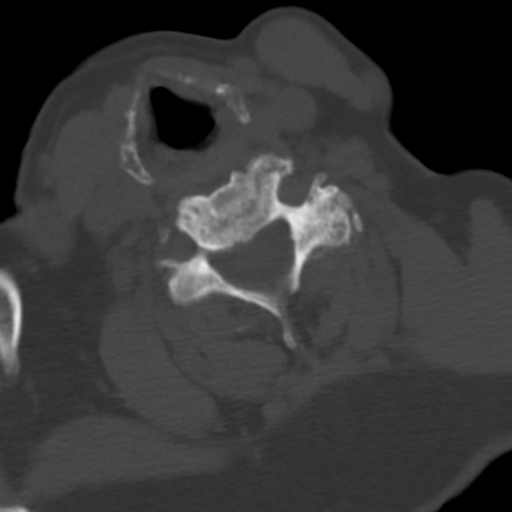
[im 48/90  bone]
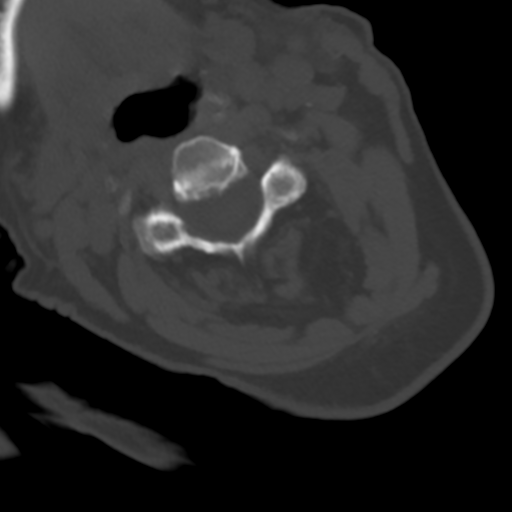
[im 62/90  bone]
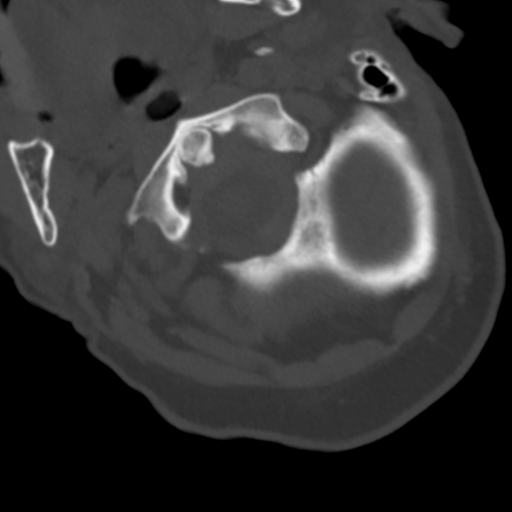
[im 76/90  soft-tissue]
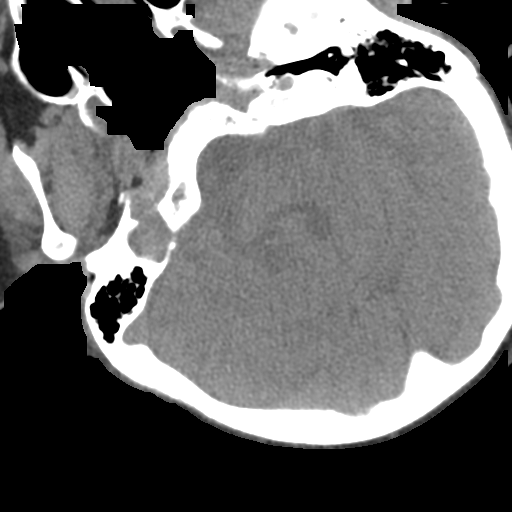
[im 76/90  bone]
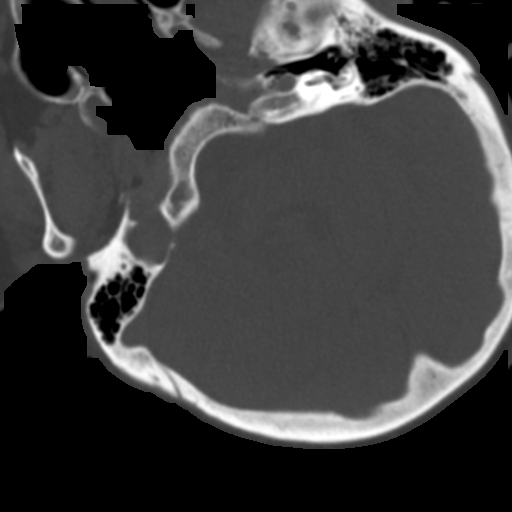

[Series 5: sag bone · sagittal · 0.26mm/px · 5 of 61 slices shown, 6 images]
[im 21/61  bone]
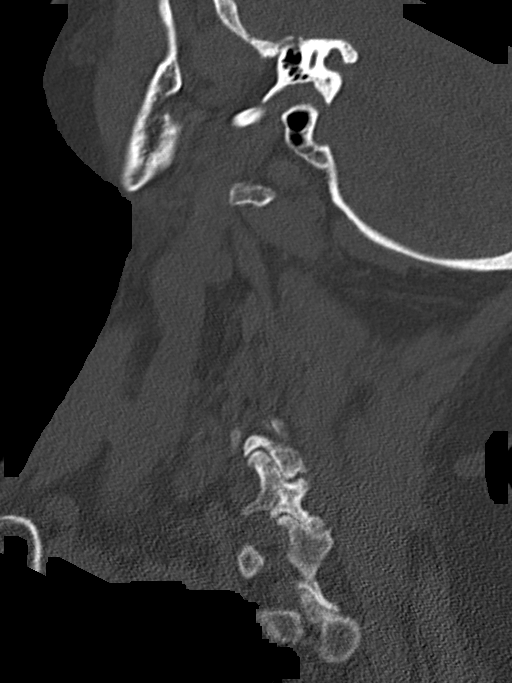
[im 26/61  bone]
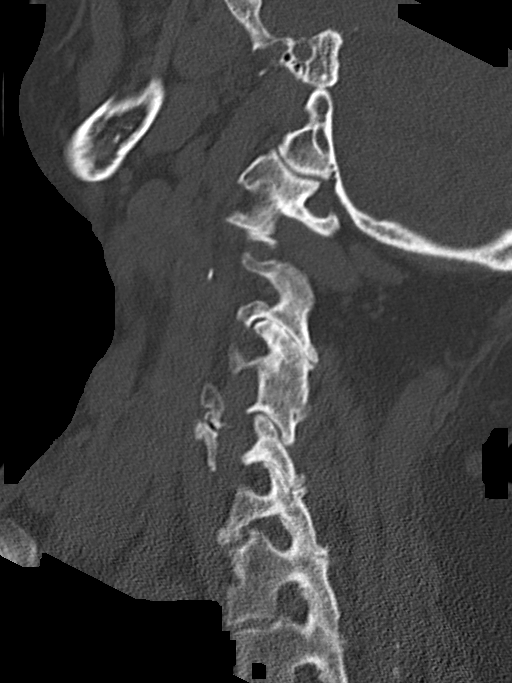
[im 31/61  soft-tissue]
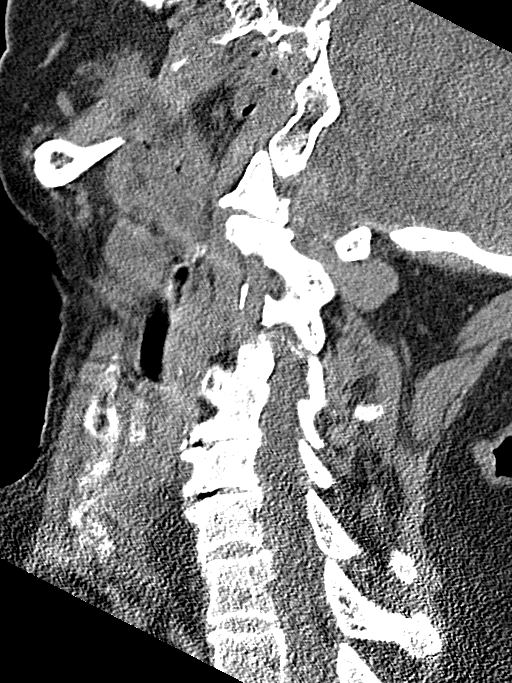
[im 31/61  bone]
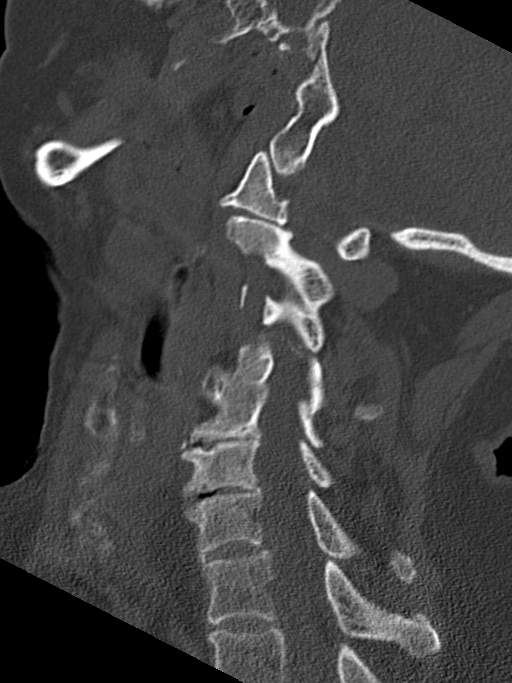
[im 36/61  bone]
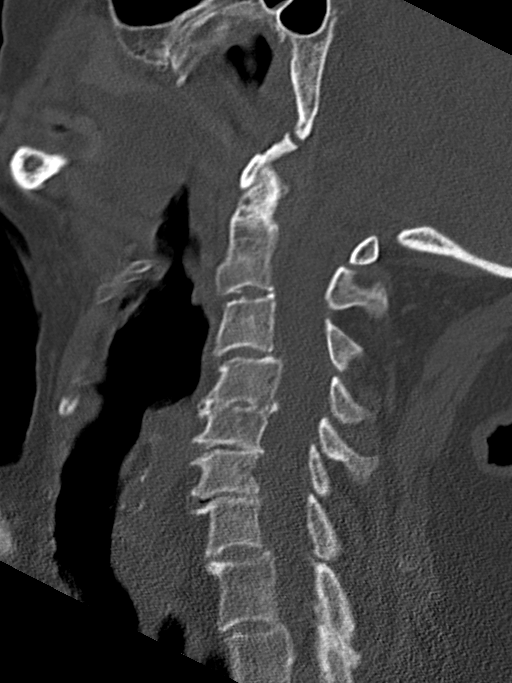
[im 41/61  bone]
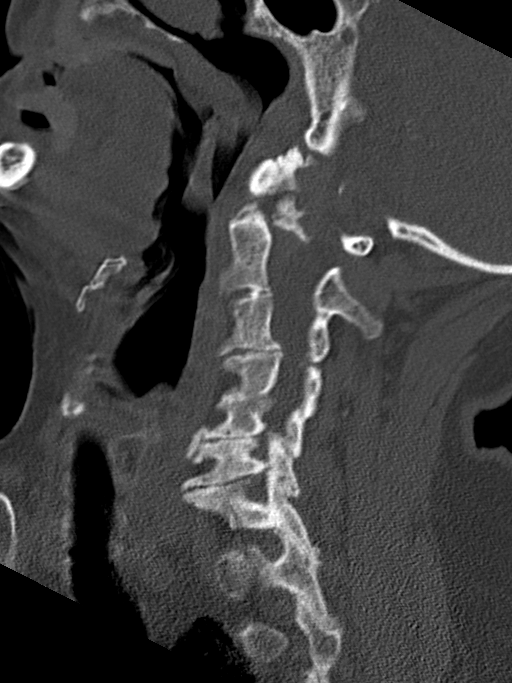

[Series 6: cor bone · coronal · 0.26mm/px · 3 of 61 slices shown]
[im 13/61  bone]
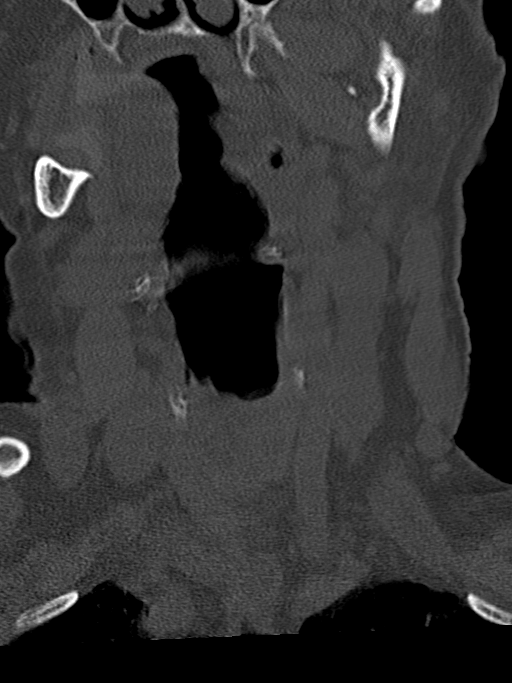
[im 25/61  bone]
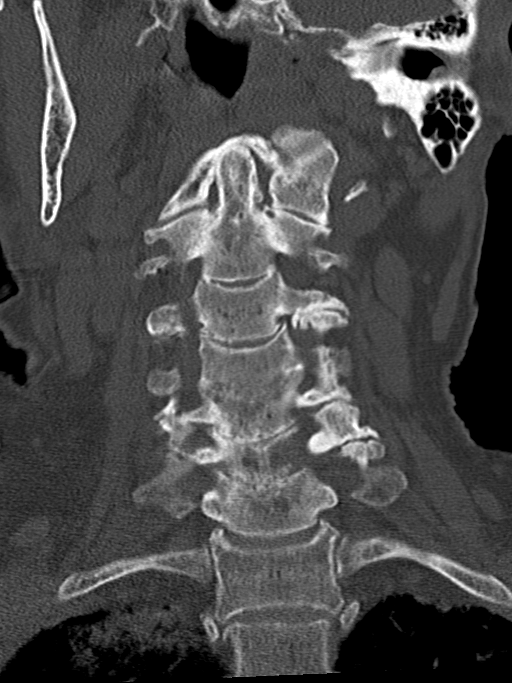
[im 37/61  bone]
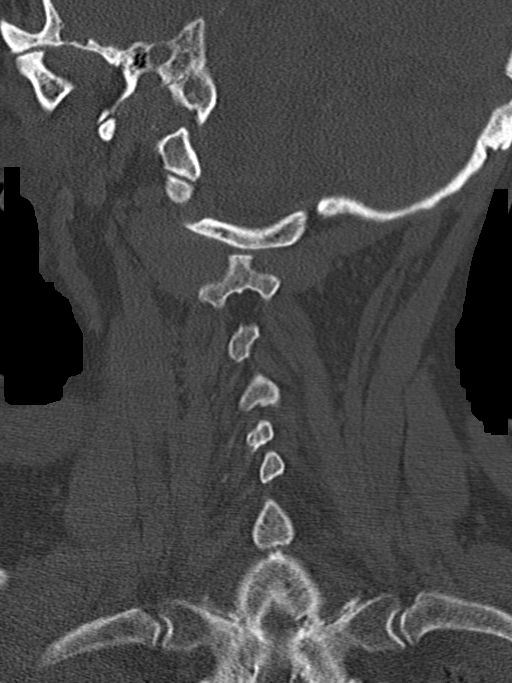

[13 of 33 positions shown; findings below may reference images not displayed]

FINDINGS: CT HEAD FINDINGS

Brain: Loss of the normal gray-white matter differentiation in the
right superior frontal gyrus. No evidence of hemorrhage,
hydrocephalus, extra-axial collection or mass lesion/mass effect.
Old left cerebellar and right thalamus infarcts. Moderate cerebral
atrophy and chronic microvascular ischemic changes.

Vascular: Calcified atherosclerosis at the skullbase. No hyperdense
vessel.

Skull: Normal. Negative for fracture or focal lesion.

Other: None.

CT MAXILLOFACIAL FINDINGS

Osseous: No fracture or mandibular dislocation. No destructive
process.

Orbits: Negative. No traumatic or inflammatory finding.

Sinuses: Clear.

Soft tissues: Negative.

CT CERVICAL SPINE FINDINGS

Alignment: No traumatic malalignment. Mild facet mediated
anterolisthesis at C3-C4 and C7-T1.

Skull base and vertebrae: No acute fracture. No primary bone lesion
or focal pathologic process.

Soft tissues and spinal canal: No prevertebral fluid or swelling. No
visible canal hematoma.

Disc levels: Advanced multilevel disc height loss and facet
uncovertebral hypertrophy.

Upper chest: Biapical pleuroparenchymal scarring.

Other: None.
IMPRESSION: 1. Loss of the normal gray-white matter differentiation in the right
superior frontal gyrus, concerning for acute to subacute infarct. No
acute intracranial hemorrhage.
2. Old left cerebellar and right thalamus infarcts.
3. No acute maxillofacial fracture.
4. No acute cervical spine fracture or traumatic listhesis. Advanced
multilevel degenerative changes.

## 2021-05-18 IMAGING — CT CT HEAD W/O CM
1 series · 15 of 30 positions shown, 19 images · non-contrast
Comparison: None.

CLINICAL DATA: Fall.

EXAM:
CT HEAD WITHOUT CONTRAST
CT MAXILLOFACIAL WITHOUT CONTRAST
CT CERVICAL SPINE WITHOUT CONTRAST
TECHNIQUE: Multidetector CT imaging of the head, cervical spine, and
maxillofacial structures were performed using the standard protocol
without intravenous contrast. Multiplanar CT image reconstructions
of the cervical spine and maxillofacial structures were also
generated.

[Series 2: head w o · axial · 0.47mm/px · z∈[+24,+174]mm · 15 of 34 slices shown, 19 images]
[im 2/34  brain]
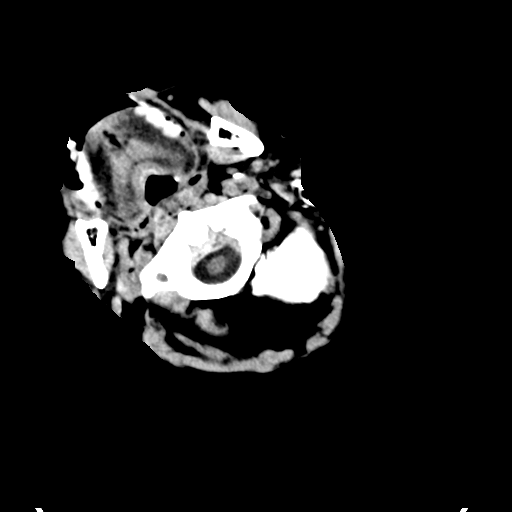
[im 2/34  bone]
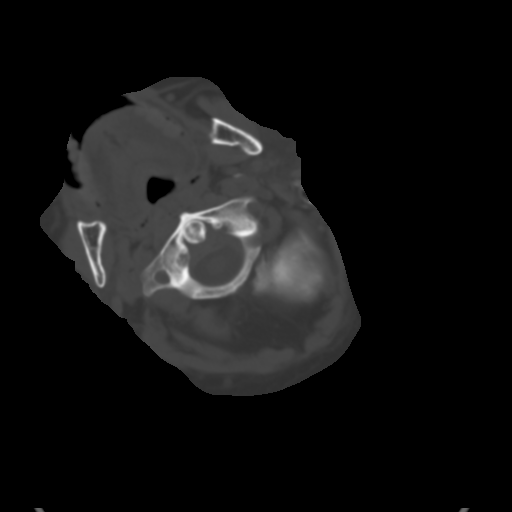
[im 4/34  brain]
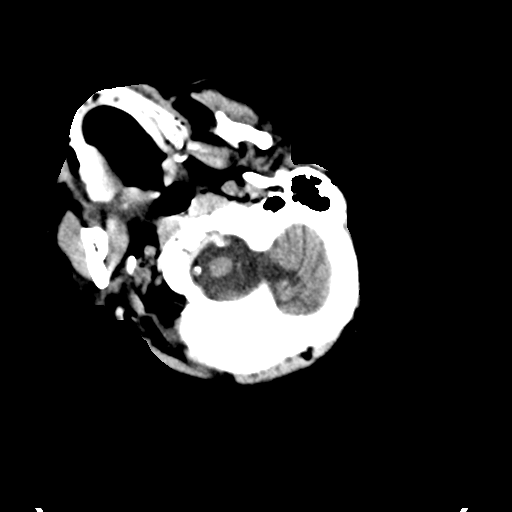
[im 6/34  brain]
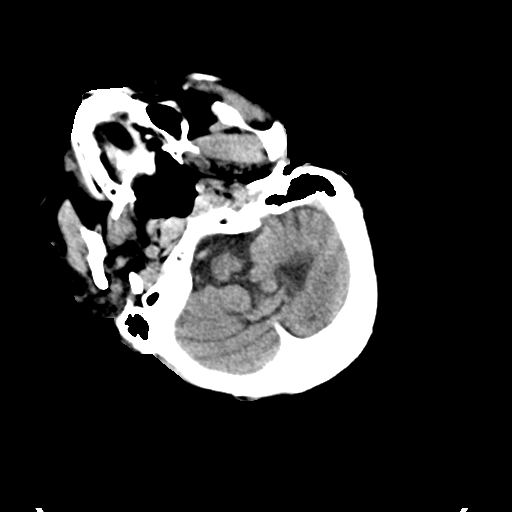
[im 8/34  brain]
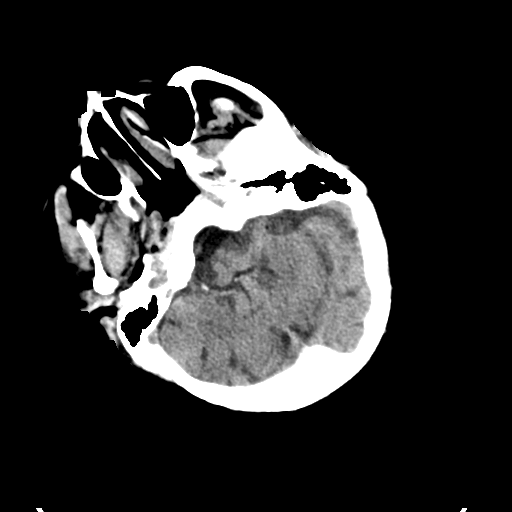
[im 11/34  brain]
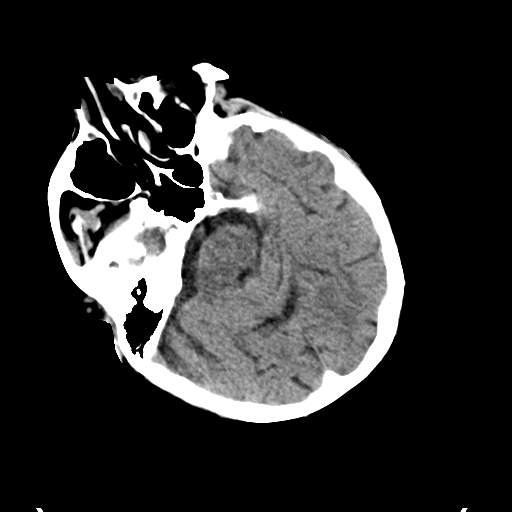
[im 11/34  bone]
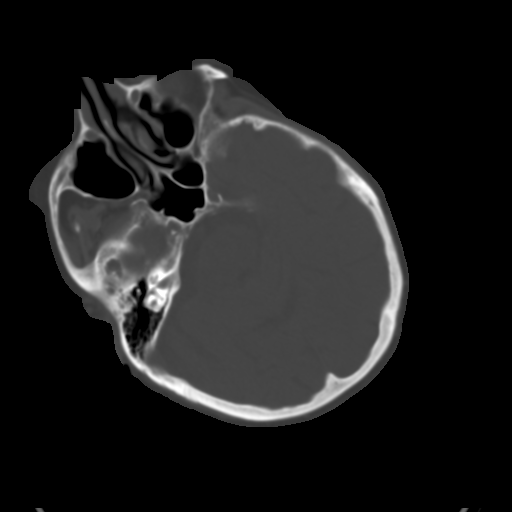
[im 13/34  brain]
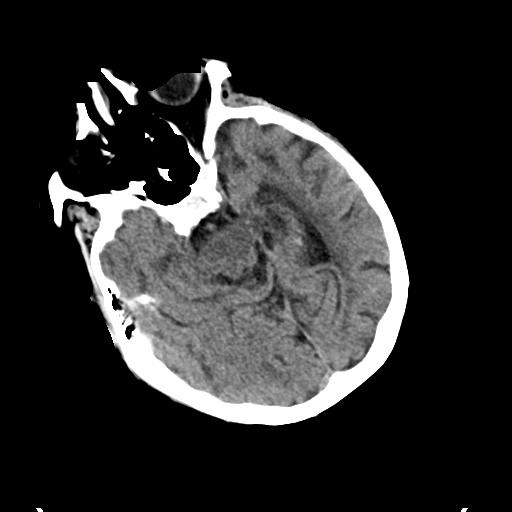
[im 15/34  brain]
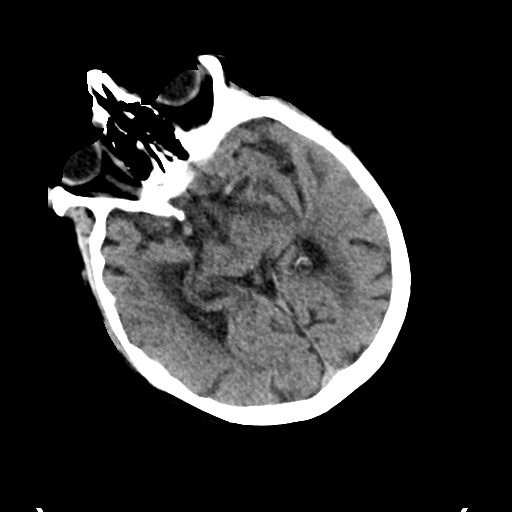
[im 18/34  brain]
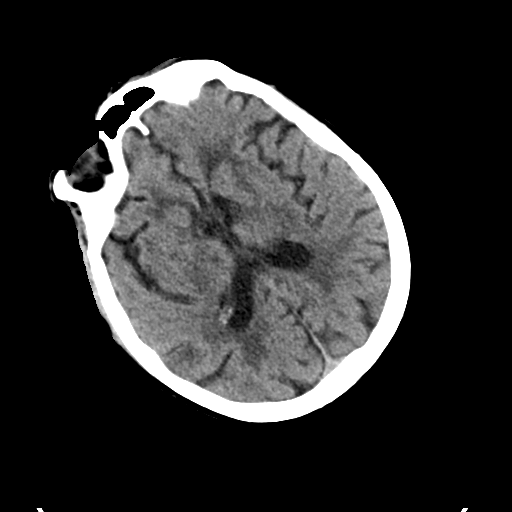
[im 19/34  brain]
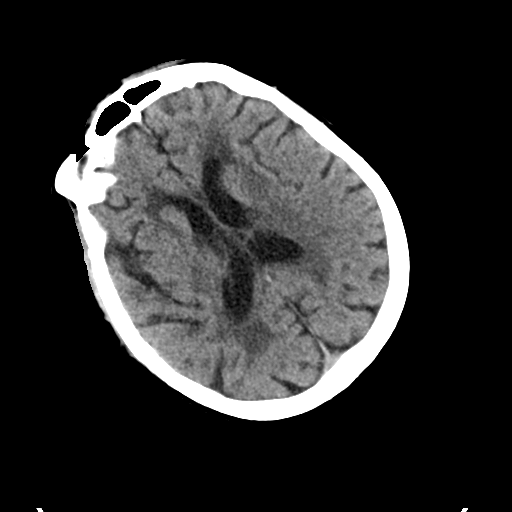
[im 19/34  bone]
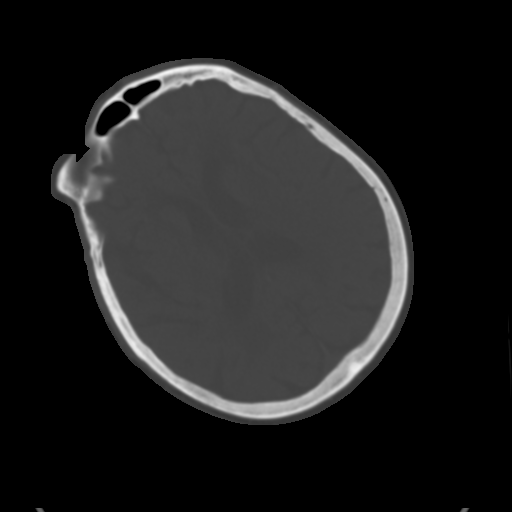
[im 21/34  brain]
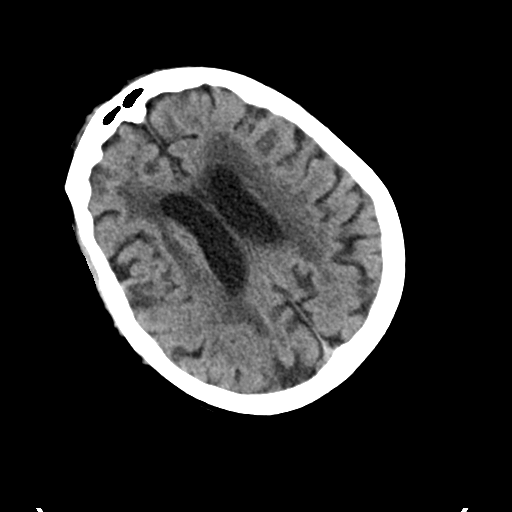
[im 23/34  brain]
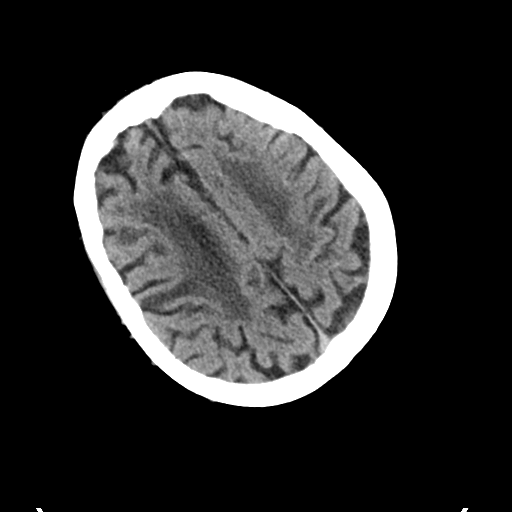
[im 26/34  brain]
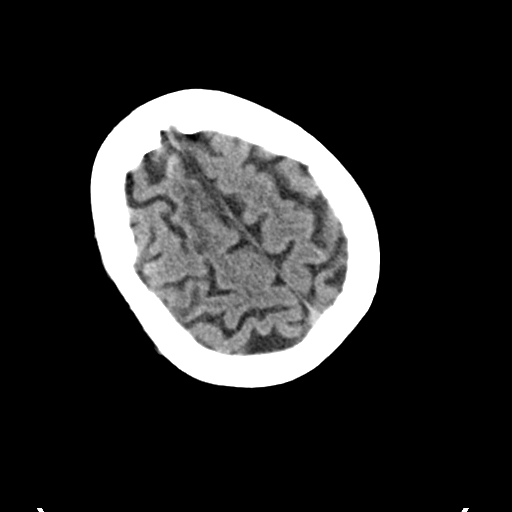
[im 28/34  brain]
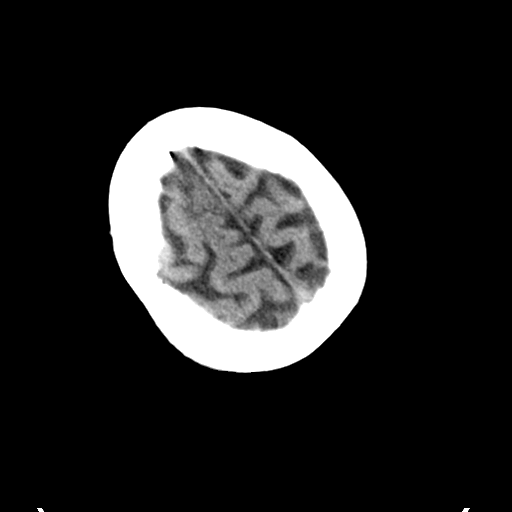
[im 28/34  bone]
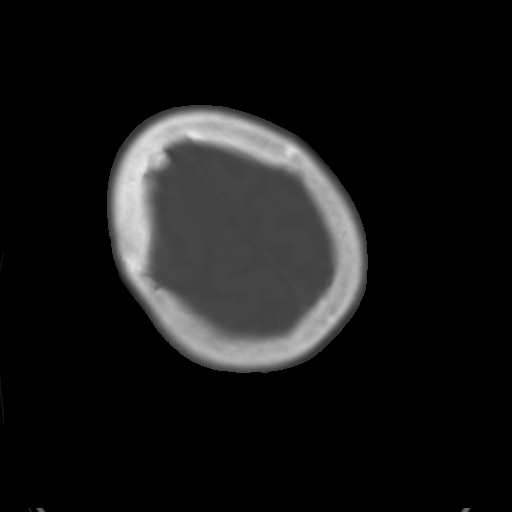
[im 30/34  brain]
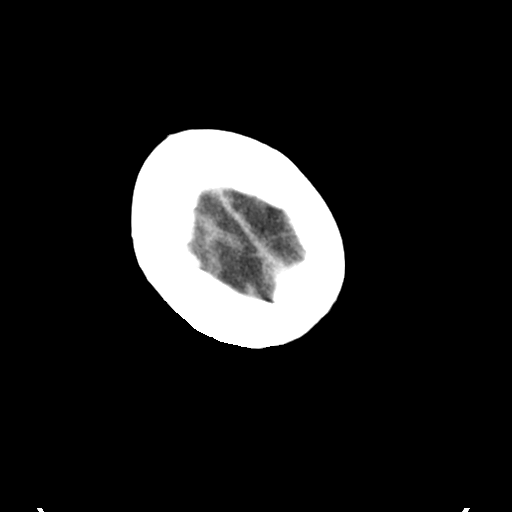
[im 32/34  brain]
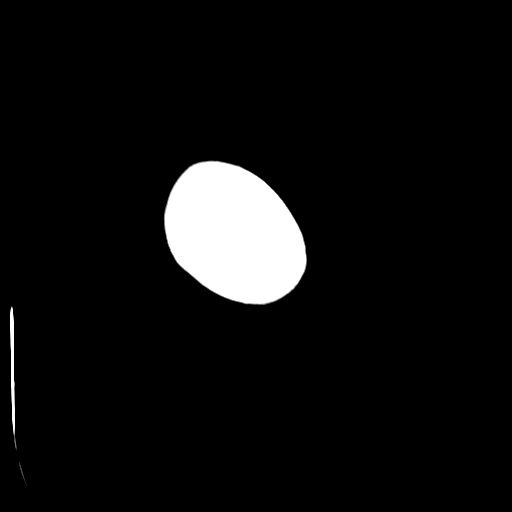

[15 of 30 positions shown; findings below may reference images not displayed]

FINDINGS: CT HEAD FINDINGS

Brain: Loss of the normal gray-white matter differentiation in the
right superior frontal gyrus. No evidence of hemorrhage,
hydrocephalus, extra-axial collection or mass lesion/mass effect.
Old left cerebellar and right thalamus infarcts. Moderate cerebral
atrophy and chronic microvascular ischemic changes.

Vascular: Calcified atherosclerosis at the skullbase. No hyperdense
vessel.

Skull: Normal. Negative for fracture or focal lesion.

Other: None.

CT MAXILLOFACIAL FINDINGS

Osseous: No fracture or mandibular dislocation. No destructive
process.

Orbits: Negative. No traumatic or inflammatory finding.

Sinuses: Clear.

Soft tissues: Negative.

CT CERVICAL SPINE FINDINGS

Alignment: No traumatic malalignment. Mild facet mediated
anterolisthesis at C3-C4 and C7-T1.

Skull base and vertebrae: No acute fracture. No primary bone lesion
or focal pathologic process.

Soft tissues and spinal canal: No prevertebral fluid or swelling. No
visible canal hematoma.

Disc levels: Advanced multilevel disc height loss and facet
uncovertebral hypertrophy.

Upper chest: Biapical pleuroparenchymal scarring.

Other: None.
IMPRESSION: 1. Loss of the normal gray-white matter differentiation in the right
superior frontal gyrus, concerning for acute to subacute infarct. No
acute intracranial hemorrhage.
2. Old left cerebellar and right thalamus infarcts.
3. No acute maxillofacial fracture.
4. No acute cervical spine fracture or traumatic listhesis. Advanced
multilevel degenerative changes.

## 2021-05-18 MED ORDER — SODIUM CHLORIDE 0.9 % IV SOLN: INTRAVENOUS | Status: DC

## 2021-05-18 MED ORDER — ASPIRIN 325 MG PO TABS
325.0000 mg | ORAL_TABLET | Freq: Every day | ORAL | Status: DC
  Administered 2021-05-18: 325 mg via ORAL
  Filled 2021-05-18 (×2): qty 1

## 2021-05-18 MED ORDER — CLOPIDOGREL BISULFATE 75 MG PO TABS
75.0000 mg | ORAL_TABLET | Freq: Every day | ORAL | Status: DC
  Filled 2021-05-18: qty 1

## 2021-05-18 MED ORDER — ACETAMINOPHEN 325 MG PO TABS
650.0000 mg | ORAL_TABLET | ORAL | Status: DC | PRN
  Administered 2021-05-18: 650 mg via ORAL
  Filled 2021-05-18: qty 2

## 2021-05-18 MED ORDER — MOMETASONE FURO-FORMOTEROL FUM 100-5 MCG/ACT IN AERO
2.0000 | INHALATION_SPRAY | Freq: Two times a day (BID) | RESPIRATORY_TRACT | Status: DC
  Administered 2021-05-18 – 2021-05-19 (×2): 2 via RESPIRATORY_TRACT
  Filled 2021-05-18: qty 8.8

## 2021-05-18 MED ORDER — SENNOSIDES-DOCUSATE SODIUM 8.6-50 MG PO TABS: 1.0000 | ORAL_TABLET | Freq: Every evening | ORAL | Status: DC | PRN

## 2021-05-18 MED ORDER — ACETAMINOPHEN 160 MG/5ML PO SOLN: 650.0000 mg | ORAL | Status: DC | PRN

## 2021-05-18 MED ORDER — ACETAMINOPHEN 650 MG RE SUPP: 650.0000 mg | RECTAL | Status: DC | PRN

## 2021-05-18 MED ORDER — ENOXAPARIN SODIUM 40 MG/0.4ML IJ SOSY
40.0000 mg | PREFILLED_SYRINGE | INTRAMUSCULAR | Status: DC
  Administered 2021-05-18: 40 mg via SUBCUTANEOUS
  Filled 2021-05-18: qty 0.4

## 2021-05-18 MED ORDER — ASPIRIN 300 MG RE SUPP: 300.0000 mg | Freq: Every day | RECTAL | Status: DC

## 2021-05-18 MED ORDER — ORAL CARE MOUTH RINSE
15.0000 mL | Freq: Two times a day (BID) | OROMUCOSAL | Status: DC
  Administered 2021-05-19 (×2): 15 mL via OROMUCOSAL

## 2021-05-18 MED ORDER — STROKE: EARLY STAGES OF RECOVERY BOOK: Freq: Once | Status: AC

## 2021-05-18 MED ORDER — VITAMIN B-12 1000 MCG PO TABS
500.0000 ug | ORAL_TABLET | Freq: Every day | ORAL | Status: DC
  Filled 2021-05-18 (×4): qty 1

## 2021-05-18 NOTE — ED Notes (Signed)
Patient transported to MRI 

## 2021-05-18 NOTE — ED Notes (Signed)
Patient hard of hearing. Pt came in without hearing aids.

## 2021-05-18 NOTE — ED Notes (Signed)
Patient's head and face cleansed with saline at this time.

## 2021-05-18 NOTE — ED Notes (Signed)
Pt brief soiled from facility. Patient cleaned. purewick placed.

## 2021-05-18 NOTE — ED Notes (Signed)
Report received from kayla, RN 

## 2021-05-18 NOTE — H&P (Signed)
History and Physical    Debra Pratt ZOX:[id number] DOB: 05/20/1925 DOA: 05/18/2021  PCP: Patient, No Pcp Per (Inactive)  Patient coming from: [facility]  I have personally briefly reviewed patient's old medical [facility]  Chief Complaint: Fall  HPI: Debra Rossettiorma Cregan is a 87 y.o. female with medical history significant of dementia, asthma, frequent falls.  Patient's daughter reports that she is a resident of ALF and was previously ambulating with a walker/cane, but due to recurrent falls and safety concerns, she has been mostly in a wheelchair.  Her daughter thinks that patient try to get up on her own today without any assistance and her legs gave out causing her to fall.  Patient does admit to hitting her head on her bed rail.  She denies any loss of consciousness.  Otherwise history is limited since patient is so hard of hearing and may have some confusion.  ED Course: Basic imaging was unrevealing for any deep bony injuries including x-ray pelvis, chest x-ray and CT scans of her head face and C-spine.  There was concern for possible stroke on CT therefore MRI brain was performed that indicated possible CVA.  She has been referred for admission for further work-up.  Review of Systems: Unable to assess due to mental status   Past Medical History:  Diagnosis Date   Dementia (HCC)     History reviewed. No pertinent surgical history.  Social History:  reports that she has quit smoking. Her smoking use included cigarettes. She has never used smokeless tobacco. She reports that she does not currently use alcohol. She reports that she does not currently use drugs.  No Known Allergies   Family history: Family history reviewed and not pertinent  Prior to Admission medications   Medication Sig Start Date End Date Taking? Authorizing Provider  acetaminophen (TYLENOL) 500 MG tablet Take 500 mg by mouth every 6 (six) hours as needed for mild pain, fever or headache.   Yes  [provider]  albuterol (VENTOLIN HFA) 108 (90 Base) MCG/ACT inhaler Inhale 1-2 puffs into the lungs every 6 (six) hours as needed for wheezing or shortness of breath. 05/12/21  Yes [provider]  Calcium Carbonate (CALCIUM 500 PO) Take 1 tablet by mouth daily.   Yes [provider]  Cholecalciferol (D3 5000) 125 MCG (5000 UT) capsule Take 5,000 Units by mouth daily.   Yes [provider]  SYMBICORT 80-4.5 MCG/ACT inhaler Inhale 2 puffs into the lungs 2 (two) times daily. 04/28/21  Yes [provider]  vitamin B-12 (CYANOCOBALAMIN) 500 MCG tablet Take 500 mcg by mouth daily. 11/27/20  Yes [provider]    Physical Exam: Vitals:   05/18/21 1440 05/18/21 1503 05/18/21 1507 05/18/21 1817  BP:  (!) 192/106 (!) 161/92 (!) 153/71  Pulse:  92 92 80  Resp:  20    Temp: 98.6 F (37 C) 98.1 F (36.7 C)    TempSrc: Oral Oral    SpO2:  99%  97%  Weight:      Height:        Constitutional: NAD, calm, comfortable Eyes: PERRL, lids and conjunctivae normal ENMT: Mucous membranes are moist. Posterior pharynx clear of any exudate or lesions.Normal dentition.  Neck: normal, supple, no masses, no thyromegaly Respiratory: clear to auscultation bilaterally, no wheezing, no crackles. Normal respiratory effort. No accessory muscle use.  Cardiovascular: Regular rate and rhythm, no murmurs / rubs / gallops. No extremity edema. 2+ pedal pulses. No carotid bruits.  Abdomen: no tenderness, no masses palpated. No hepatosplenomegaly. Bowel sounds positive.  Musculoskeletal: no clubbing / cyanosis. No joint deformity upper and lower extremities. Good ROM, no contractures. Normal muscle tone.  Skin: no rashes, lesions, ulcers. No induration Neurologic: Limited cooperation since she is so hard of hearing, speech is mildly slurred, 3-4 out of 5 strength in left lower extremities, otherwise 5 out of 5 strength in remainder of extremities Psychiatric: Limited  exam since she is so hard of hearing   Labs on Admission: I have personally reviewed following labs and imaging studies  CBC: Recent Labs  Lab 05/18/21 0640  WBC 10.3  NEUTROABS 6.9  HGB 12.3  HCT 39.3  MCV 92.3  PLT 243   Basic Metabolic Panel: Recent Labs  Lab 05/18/21 0640  NA 137  K 4.2  CL 103  CO2 27  GLUCOSE 106*  BUN 23  CREATININE 1.09*  CALCIUM 8.8*   GFR: Estimated Creatinine Clearance: 34.5 mL/min (A) (by C-G formula based on SCr of 1.09 mg/dL (H)). Liver Function Tests: Recent Labs  Lab 05/18/21 0640  AST 20  ALT 21  ALKPHOS 68  BILITOT 0.5  PROT 7.3  ALBUMIN 3.7   No results for input(s): LIPASE, AMYLASE in the last 168 hours. No results for input(s): AMMONIA in the last 168 hours. Coagulation Profile: No results for input(s): INR, PROTIME in the last 168 hours. Cardiac Enzymes: No results for input(s): CKTOTAL, CKMB, CKMBINDEX, TROPONINI in the last 168 hours. BNP (last 3 results) No results for input(s): PROBNP in the last 8760 hours. HbA1C: No results for input(s): HGBA1C in the last 72 hours. CBG: No results for input(s): GLUCAP in the last 168 hours. Lipid Profile: No results for input(s): CHOL, HDL, LDLCALC, TRIG, CHOLHDL, LDLDIRECT in the last 72 hours. Thyroid Function Tests: No results for input(s): TSH, T4TOTAL, FREET4, T3FREE, THYROIDAB in the last 72 hours. Anemia Panel: No results for input(s): VITAMINB12, FOLATE, FERRITIN, TIBC, IRON, RETICCTPCT in the last 72 hours. Urine analysis:    Component Value Date/Time   COLORURINE STRAW (A) 05/18/2021 0557   APPEARANCEUR CLEAR 05/18/2021 0557   LABSPEC 1.008 05/18/2021 0557   PHURINE 7.0 05/18/2021 0557   GLUCOSEU NEGATIVE 05/18/2021 0557   HGBUR NEGATIVE 05/18/2021 0557   BILIRUBINUR NEGATIVE 05/18/2021 0557   KETONESUR NEGATIVE 05/18/2021 0557   PROTEINUR NEGATIVE 05/18/2021 0557   NITRITE NEGATIVE 05/18/2021 0557   LEUKOCYTESUR NEGATIVE 05/18/2021 0557    Radiological  Exams on Admission: DG Chest 1 View  Result Date: 05/18/2021 CLINICAL DATA:  Fall. EXAM: CHEST  1 VIEW COMPARISON:  None. FINDINGS: The heart size and mediastinal contours are within normal limits. Both lungs are clear. The visualized skeletal structures are unremarkable. IMPRESSION: No active disease. Electronically Signed   By: Obie Dredge M.D.   On: 05/18/2021 06:36   DG Pelvis 1-2 Views  Result Date: 05/18/2021 CLINICAL DATA:  Fall. EXAM: PELVIS - 1-2 VIEW COMPARISON:  None. FINDINGS: There is no evidence of pelvic fracture or diastasis. No pelvic bone lesions are seen. Moderate to severe left and mild-to-moderate right hip osteoarthritis. IMPRESSION: 1. No acute osseous abnormality. Electronically Signed   By: Obie Dredge M.D.   On: 05/18/2021 06:37   CT HEAD WO CONTRAST ( )  Result Date: 05/18/2021 CLINICAL DATA:  Fall. EXAM: CT HEAD WITHOUT CONTRAST CT MAXILLOFACIAL WITHOUT CONTRAST CT CERVICAL SPINE WITHOUT CONTRAST TECHNIQUE: Multidetector CT imaging of the head, cervical spine, and maxillofacial structures were performed using the standard protocol without intravenous  contrast. Multiplanar CT image reconstructions of the cervical spine and maxillofacial structures were also generated. COMPARISON:  None. FINDINGS: CT HEAD FINDINGS Brain: Loss of the normal gray-white matter differentiation in the right superior frontal gyrus. No evidence of hemorrhage, hydrocephalus, extra-axial collection or mass lesion/mass effect. 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 Moderate cerebral atrophy and chronic microvascular ischemic changes. Vascular: Calcified atherosclerosis at the skullbase. No hyperdense vessel. Skull: Normal. Negative for fracture or focal lesion. Other: None. CT MAXILLOFACIAL FINDINGS Osseous: No fracture or mandibular dislocation. No destructive process. Orbits: Negative. No traumatic or inflammatory finding. Sinuses: Clear. Soft tissues: Negative. CT CERVICAL SPINE  FINDINGS Alignment: No traumatic malalignment. Mild facet mediated anterolisthesis at C3-C4 and C7-T1. Skull base and vertebrae: No acute fracture. No primary bone lesion or focal pathologic process. Soft tissues and spinal canal: No prevertebral fluid or swelling. No visible canal hematoma. Disc levels: Advanced multilevel disc height loss and facet uncovertebral hypertrophy. Upper chest: Biapical pleuroparenchymal scarring. Other: None. IMPRESSION: 1. Loss of the normal gray-white matter differentiation in the right superior frontal gyrus, concerning for acute to subacute infarct. No acute intracranial hemorrhage. 2. 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 3. No acute maxillofacial fracture. 4. No acute cervical spine fracture or traumatic listhesis. Advanced multilevel degenerative changes. Electronically Signed   By: Obie Dredge M.D.   On: 05/18/2021 06:46   CT Cervical Spine Wo Contrast  Result Date: 05/18/2021 CLINICAL DATA:  Fall. EXAM: CT HEAD WITHOUT CONTRAST CT MAXILLOFACIAL WITHOUT CONTRAST CT CERVICAL SPINE WITHOUT CONTRAST TECHNIQUE: Multidetector CT imaging of the head, cervical spine, and maxillofacial structures were performed using the standard protocol without intravenous contrast. Multiplanar CT image reconstructions of the cervical spine and maxillofacial structures were also generated. COMPARISON:  None. FINDINGS: CT HEAD FINDINGS Brain: Loss of the normal gray-white matter differentiation in the right superior frontal gyrus. No evidence of hemorrhage, hydrocephalus, extra-axial collection or mass lesion/mass effect. 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 Moderate cerebral atrophy and chronic microvascular ischemic changes. Vascular: Calcified atherosclerosis at the skullbase. No hyperdense vessel. Skull: Normal. Negative for fracture or focal lesion. Other: None. CT MAXILLOFACIAL FINDINGS Osseous: No fracture or mandibular dislocation. No destructive process. Orbits:  Negative. No traumatic or inflammatory finding. Sinuses: Clear. Soft tissues: Negative. CT CERVICAL SPINE FINDINGS Alignment: No traumatic malalignment. Mild facet mediated anterolisthesis at C3-C4 and C7-T1. Skull base and vertebrae: No acute fracture. No primary bone lesion or focal pathologic process. Soft tissues and spinal canal: No prevertebral fluid or swelling. No visible canal hematoma. Disc levels: Advanced multilevel disc height loss and facet uncovertebral hypertrophy. Upper chest: Biapical pleuroparenchymal scarring. Other: None. IMPRESSION: 1. Loss of the normal gray-white matter differentiation in the right superior frontal gyrus, concerning for acute to subacute infarct. No acute intracranial hemorrhage. 2. 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 3. No acute maxillofacial fracture. 4. No acute cervical spine fracture or traumatic listhesis. Advanced multilevel degenerative changes. Electronically Signed   By: Obie Dredge M.D.   On: 05/18/2021 06:46   MR ANGIO HEAD WO CONTRAST  Result Date: 05/18/2021 CLINICAL DATA:  Neuro deficit, acute, stroke suspected. Additional history provided: Fall, forehead laceration, altered mental status. EXAM: MRI HEAD WITHOUT CONTRAST MRA HEAD WITHOUT CONTRAST TECHNIQUE: Multiplanar, multi-echo pulse sequences of the brain and surrounding structures were acquired without intravenous contrast. Angiographic images of the Circle of Willis were acquired using MRA technique without intravenous contrast. COMPARISON:  CT head/maxillofacial performed earlier today 05/18/2021. FINDINGS: MRI HEAD FINDINGS Brain: The patient  was unable to tolerate the full examination. As a result, only axial and coronal diffusion-weighted sequences, a sagittal T1 weighted sequence, axial T2 TSE sequence and an axial T2 GRE sequence could be obtained. The acquired imaging is motion degraded. Most notably, there is moderate motion degradation of the coronal diffusion-weighted  sequence, severe motion degradation of the sagittal T1 weighted sequence, severe motion degradation of the axial T2 TSE sequence and severe motion degradation of the axial T2 GRE sequence. Mild generalized cerebral and cerebellar atrophy. There is a focus of cortical/subcortical diffusion weighted signal abnormality within the right superior frontal gyrus measuring 3.8 x 0.9 cm in transaxial dimensions. There are some regions of corresponding hypointense signal on the ADC map, and this finding is consistent with an acute/subacute infarct. Incompletely assessed multifocal T2/FLAIR hyperintensity within the cerebral white matter and pons, nonspecific but compatible with chronic small vessel ischemic disease. Chronic lacunar infarct within the right thalamus. Chronic infarcts within the bilateral cerebellar hemispheres. Within the limitations of motion degradation, no intracranial mass, chronic intracranial blood products or extra-axial fluid collection is identified. No midline shift. Vascular: Flow voids poorly assessed due to the degree of motion degradation. Skull and upper cervical spine: Within limitations of motion degradation, no focal suspicious marrow lesion is identified. Sinuses/Orbits: Within limitations of motion degradation, no acute orbital abnormality or significant paranasal sinus disease is identified. MRA HEAD FINDINGS The examination is moderate to severely motion degraded. This significantly limits evaluation for vessel occlusions. This also precludes adequate evaluation for intracranial arterial stenoses and for aneurysms. Anterior circulation: The intracranial internal carotid arteries are patent. The M1 middle cerebral arteries are patent. No M2 proximal branch occlusion is identified. The anterior cerebral arteries are patent proximally. However, motion degradation precludes adequate evaluation of the anterior cerebral arteries beginning at the A2/A3 junction. Posterior circulation: The  intracranial left vertebral artery is poorly delineated, and patency of this vessel cannot be established. The intracranial right vertebral artery is patent. The basilar artery is patent. The posterior cerebral arteries are patent proximally. Atherosclerotic irregularity of both vessels. Most notably, there are sites of moderate stenosis within the P2 left PCA. Motion degradation precludes adequate evaluation of the posterior cerebral arteries beyond the P3 segment level. A left posterior communicating artery is present. The right posterior communicating artery is hypoplastic or absent. Anatomic variants: As described IMPRESSION: MRI brain: 1. Prematurely terminated, significantly motion degraded and limited examination as described. 2. 3.8 x 0.9 cm acute/subacute cortical and subcortical infarct within the right superior frontal gyrus (right ACA vascular territory). 3. Chronic right thalamic lacunar infarct. 4. Incompletely assessed chronic small vessel ischemic disease within the cerebral white matter and pons. 5. Chronic infarcts within the bilateral cerebellar hemispheres. 6. Mild generalized parenchymal atrophy. MRA head: 1. The examination is moderate to severely motion degraded. This significantly limits evaluation for vessel occlusions. This also precludes adequate evaluation for intracranial arterial stenoses and aneurysms. 2. No definite anterior circulation proximal large vessel occlusion is identified. However, motion degradation precludes adequate evaluation of the anterior cerebral arteries beginning at the A2/A3 junction. CT angiography may be obtained for further evaluation, as clinically warranted. 3. The intracranial left vertebral artery is poorly delineated, and patency of this vessel cannot be established. 4. Sites of moderate stenosis within the P2 left PCA. Electronically Signed   By: Jackey Loge D.O.   On: 05/18/2021 08:18   MR BRAIN WO CONTRAST  Result Date: 05/18/2021 CLINICAL DATA:   Neuro deficit, acute, stroke suspected. Additional history provided:  Fall, forehead laceration, altered mental status. EXAM: MRI HEAD WITHOUT CONTRAST MRA HEAD WITHOUT CONTRAST TECHNIQUE: Multiplanar, multi-echo pulse sequences of the brain and surrounding structures were acquired without intravenous contrast. Angiographic images of the Circle of Willis were acquired using MRA technique without intravenous contrast. COMPARISON:  CT head/maxillofacial performed earlier today 05/18/2021. FINDINGS: MRI HEAD FINDINGS Brain: The patient was unable to tolerate the full examination. As a result, only axial and coronal diffusion-weighted sequences, a sagittal T1 weighted sequence, axial T2 TSE sequence and an axial T2 GRE sequence could be obtained. The acquired imaging is motion degraded. Most notably, there is moderate motion degradation of the coronal diffusion-weighted sequence, severe motion degradation of the sagittal T1 weighted sequence, severe motion degradation of the axial T2 TSE sequence and severe motion degradation of the axial T2 GRE sequence. Mild generalized cerebral and cerebellar atrophy. There is a focus of cortical/subcortical diffusion weighted signal abnormality within the right superior frontal gyrus measuring 3.8 x 0.9 cm in transaxial dimensions. There are some regions of corresponding hypointense signal on the ADC map, and this finding is consistent with an acute/subacute infarct. Incompletely assessed multifocal T2/FLAIR hyperintensity within the cerebral white matter and pons, nonspecific but compatible with chronic small vessel ischemic disease. Chronic lacunar infarct within the right thalamus. Chronic infarcts within the bilateral cerebellar hemispheres. Within the limitations of motion degradation, no intracranial mass, chronic intracranial blood products or extra-axial fluid collection is identified. No midline shift. Vascular: Flow voids poorly assessed due to the degree of motion  degradation. Skull and upper cervical spine: Within limitations of motion degradation, no focal suspicious marrow lesion is identified. Sinuses/Orbits: Within limitations of motion degradation, no acute orbital abnormality or significant paranasal sinus disease is identified. MRA HEAD FINDINGS The examination is moderate to severely motion degraded. This significantly limits evaluation for vessel occlusions. This also precludes adequate evaluation for intracranial arterial stenoses and for aneurysms. Anterior circulation: The intracranial internal carotid arteries are patent. The M1 middle cerebral arteries are patent. No M2 proximal branch occlusion is identified. The anterior cerebral arteries are patent proximally. However, motion degradation precludes adequate evaluation of the anterior cerebral arteries beginning at the A2/A3 junction. Posterior circulation: The intracranial left vertebral artery is poorly delineated, and patency of this vessel cannot be established. The intracranial right vertebral artery is patent. The basilar artery is patent. The posterior cerebral arteries are patent proximally. Atherosclerotic irregularity of both vessels. Most notably, there are sites of moderate stenosis within the P2 left PCA. Motion degradation precludes adequate evaluation of the posterior cerebral arteries beyond the P3 segment level. A left posterior communicating artery is present. The right posterior communicating artery is hypoplastic or absent. Anatomic variants: As described IMPRESSION: MRI brain: 1. Prematurely terminated, significantly motion degraded and limited examination as described. 2. 3.8 x 0.9 cm acute/subacute cortical and subcortical infarct within the right superior frontal gyrus (right ACA vascular territory). 3. Chronic right thalamic lacunar infarct. 4. Incompletely assessed chronic small vessel ischemic disease within the cerebral white matter and pons. 5. Chronic infarcts within the bilateral  cerebellar hemispheres. 6. Mild generalized parenchymal atrophy. MRA head: 1. The examination is moderate to severely motion degraded. This significantly limits evaluation for vessel occlusions. This also precludes adequate evaluation for intracranial arterial stenoses and aneurysms. 2. No definite anterior circulation proximal large vessel occlusion is identified. However, motion degradation precludes adequate evaluation of the anterior cerebral arteries beginning at the A2/A3 junction. CT angiography may be obtained for further evaluation, as clinically warranted. 3. The  intracranial left vertebral artery is poorly delineated, and patency of this vessel cannot be established. 4. Sites of moderate stenosis within the P2 left PCA. Electronically Signed   By: Jackey Loge D.O.   On: 05/18/2021 08:18   CT Maxillofacial Wo Contrast  Result Date: 05/18/2021 CLINICAL DATA:  Fall. EXAM: CT HEAD WITHOUT CONTRAST CT MAXILLOFACIAL WITHOUT CONTRAST CT CERVICAL SPINE WITHOUT CONTRAST TECHNIQUE: Multidetector CT imaging of the head, cervical spine, and maxillofacial structures were performed using the standard protocol without intravenous contrast. Multiplanar CT image reconstructions of the cervical spine and maxillofacial structures were also generated. COMPARISON:  None. FINDINGS: CT HEAD FINDINGS Brain: Loss of the normal gray-white matter differentiation in the right superior frontal gyrus. No evidence of hemorrhage, hydrocephalus, extra-axial collection or mass lesion/mass effect. 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 Moderate cerebral atrophy and chronic microvascular ischemic changes. Vascular: Calcified atherosclerosis at the skullbase. No hyperdense vessel. Skull: Normal. Negative for fracture or focal lesion. Other: None. CT MAXILLOFACIAL FINDINGS Osseous: No fracture or mandibular dislocation. No destructive process. Orbits: Negative. No traumatic or inflammatory finding. Sinuses: Clear. Soft tissues:  Negative. CT CERVICAL SPINE FINDINGS Alignment: No traumatic malalignment. Mild facet mediated anterolisthesis at C3-C4 and C7-T1. Skull base and vertebrae: No acute fracture. No primary bone lesion or focal pathologic process. Soft tissues and spinal canal: No prevertebral fluid or swelling. No visible canal hematoma. Disc levels: Advanced multilevel disc height loss and facet uncovertebral hypertrophy. Upper chest: Biapical pleuroparenchymal scarring. Other: None. IMPRESSION: 1. Loss of the normal gray-white matter differentiation in the right superior frontal gyrus, concerning for acute to subacute infarct. No acute intracranial hemorrhage. 2. 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 3. No acute maxillofacial fracture. 4. No acute cervical spine fracture or traumatic listhesis. Advanced multilevel degenerative changes. Electronically Signed   By: Obie Dredge M.D.   On: 05/18/2021 06:46    EKG: Independently reviewed.  Sinus rhythm without acute ST or T changes  Assessment/Plan Active Problems:   Stroke (cerebrum) (HCC)   Dementia without behavioral disturbance (HCC)   Frequent falls   Forehead laceration   Fall   Asthma   Hard of hearing     Acute CVA -MRI brain concerns possible CVA -She is not on any antiplatelet medications as an outpatient -We will start on aspirin -Check carotid Dopplers and echocardiogram -Physical therapy/Occupational Therapy/speech therapy -Lipid panels, A1c -Neurology consultation -Would likely err on the side of conservative management with her advanced age and increased risk of falls  Forehead laceration secondary to fall -No deep traumatic injuries -Repaired in ED with Steri-Strips -These can be removed in the next 10 to 14 days  Asthma -Appears stable at this time -Continue on Singulair  Dementia -Does not appear to have behavioral disturbances at present  DVT prophylaxis: Lovenox Code Status: DNR Family Communication: Discussed  with her daughter-in-law who is her legal guardian Disposition Plan: Likely return to ALF on discharge Consults called: Neurology Admission status: Observation, telemetry  Erick Blinks MD [facility]   If 7PM-7AM, please contact night-coverage [email or web address]   05/18/2021, 7:00 PM

## 2021-05-18 NOTE — ED Triage Notes (Signed)
Pt had unwitnessed fall at nursing facility this am. Pt states her legs gave out when she stood up. Unknown loc.

## 2021-05-18 NOTE — ED Notes (Signed)
Pt in CT.

## 2021-05-18 NOTE — Consult Note (Signed)
[facility] Torin Modica A. Gerilyn Pilgrim, MD     [email or web address]          Debra Pratt is an 87 y.o. female.   ASSESSMENT/PLAN: RIGHT FRONTAL CORTICAL INFARCT THAT APPEARS TO BE EMBOLIC: Dual antiplatelet agents are recommended for at least 4 weeks. The patient should have additional workup with CTA of the head and neck to evaluate the intracranial and extracranial arteries. The carotid duplex Doppler will be discontinued since this is unnecessary given this CTA. The patient may need to have a 30 day event monitor if indeed CT is negative for occlusive disease suggesting embolic phenomena. A statin is also recommended.    The patient presents with the having a fall and gait impairment. She cannot provide adequate history but seems indicate that she has been having recurrent falls. Hearing is severely impaired but she seemed not indicate focal weakness or numbness. There seemed to be no clear indication of dysarthria, dysphagia or or loss of consciousness. She complains of significant pain of the lower extremities due to arthritis in his thinks this could contribute to her gait impairment. The review systems is otherwise unrevealing although limited given this severe hearing impairment.     GENERAL: She is sitting up in bed feeding herself.  HEENT: She has severe hearing impairment which severely limits the evaluation. Neck is supple no trauma noted.  ABDOMEN: Soft  EXTREMITIES: No edema; there is severe arthritic changes of the knees bilaterally.   BACK: Normal alignment.  SKIN: Normal by inspection.    MENTAL STATUS: She does follow commands with much prompting. She is conversational and lucid but often not appropriate given the severe hearing impairment.  CRANIAL NERVES: Pupils are equal, round and reactive to light and accommodation; extraocular movements are full, there is no significant nystagmus; upper and lower facial muscles are normal in strength and symmetric, there  is no flattening of the nasolabial folds; tongue is midline; uvula is midline; shoulder elevation is normal.  MOTOR: Normal tone, bulk and strength involving the upper extremities. There is no drift to the upper extremities. Leg strength is graded at approximately 3/5 with the marked drift.  COORDINATION: Left finger to nose is normal, right finger to nose is normal, No rest tremor; no intention tremor; no postural tremor; no bradykinesia.  REFLEXES: Deep tendon reflexes are symmetrical and normal.  SENSATION: Normal to pain bilaterally      Blood pressure (!) 153/71, pulse 80, temperature 98.1 F (36.7 C), temperature source Oral, resp. rate 20, height 5\' 8"  (1.727 m), weight 77.1 kg, SpO2 97 %.  Past Medical History:  Diagnosis Date   Dementia (HCC)     History reviewed. No pertinent surgical history.  History reviewed. No pertinent family history.  Social History:  reports that she has quit smoking. Her smoking use included cigarettes. She has never used smokeless tobacco. She reports that she does not currently use alcohol. She reports that she does not currently use drugs.  Allergies: No Known Allergies  Medications: Prior to Admission medications   Medication Sig Start Date End Date Taking? Authorizing Provider  acetaminophen (TYLENOL) 500 MG tablet Take 500 mg by mouth every 6 (six) hours as needed for mild pain, fever or headache.   Yes [provider]  albuterol (VENTOLIN HFA) 108 (90 Base) MCG/ACT inhaler Inhale 1-2 puffs into the lungs every 6 (six) hours as needed for wheezing or shortness of breath. 05/12/21  Yes [provider]  Calcium Carbonate (CALCIUM 500 PO) Take 1 tablet  by mouth daily.   Yes [provider]  Cholecalciferol (D3 5000) 125 MCG (5000 UT) capsule Take 5,000 Units by mouth daily.   Yes [provider]  SYMBICORT 80-4.5 MCG/ACT inhaler Inhale 2 puffs into the lungs 2 (two) times daily. 04/28/21  Yes [provider]  vitamin B-12 (CYANOCOBALAMIN) 500 MCG tablet Take 500 mcg by mouth daily. 11/27/20  Yes [provider]    Scheduled Meds:  aspirin  300 mg Rectal Daily   Or   aspirin  325 mg Oral Daily   enoxaparin (LOVENOX) injection  40 mg Subcutaneous Q24H   mouth rinse  15 mL Mouth Rinse BID   mometasone-formoterol  2 puff Inhalation BID   vitamin B-12  500 mcg Oral Daily   Continuous Infusions:  sodium chloride 50 mL/hr at 05/18/21 1822   PRN Meds:.acetaminophen **OR** acetaminophen (TYLENOL) oral liquid 160 mg/5 mL **OR** acetaminophen, senna-docusate     Results for orders placed or performed during the hospital encounter of 05/18/21 (from the past 48 hour(s))  Urinalysis, Routine w reflex microscopic Urine, Clean Catch     Status: Abnormal   Collection Time: 05/18/21  5:57 AM  Result Value Ref Range   Color, Urine STRAW (A) YELLOW   APPearance CLEAR CLEAR   Specific Gravity, Urine 1.008 1.005 - 1.030   pH 7.0 5.0 - 8.0   Glucose, UA NEGATIVE NEGATIVE mg/dL   Hgb urine dipstick NEGATIVE NEGATIVE   Bilirubin Urine NEGATIVE NEGATIVE   Ketones, ur NEGATIVE NEGATIVE mg/dL   Protein, ur NEGATIVE NEGATIVE mg/dL   Nitrite NEGATIVE NEGATIVE   Leukocytes,Ua NEGATIVE NEGATIVE    Comment: Performed at [facility], [address], [place], [place] [address]  CBC with Differential     Status: None   Collection Time: 05/18/21  6:40 AM  Result Value Ref Range   WBC 10.3 4.0 - 10.5 K/uL   RBC 4.26 3.87 - 5.11 MIL/uL   Hemoglobin 12.3 12.0 - 15.0 g/dL   HCT 65.7 84.6 - 96.2 %   MCV 92.3 80.0 - 100.0 fL   MCH 28.9 26.0 - 34.0 pg   MCHC 31.3 30.0 - 36.0 g/dL   RDW 95.2 84.1 - 32.4 %   Platelets 243 150 - 400 K/uL   nRBC 0.0 0.0 - 0.2 %   Neutrophils Relative % 67 %   Neutro Abs 6.9 1.7 - 7.7 K/uL   Lymphocytes Relative 23 %   Lymphs Abs 2.3 0.7 - 4.0 K/uL   Monocytes Relative 8 %   Monocytes Absolute 0.9 0.1 - 1.0 K/uL   Eosinophils Relative 1 %   Eosinophils  Absolute 0.1 0.0 - 0.5 K/uL   Basophils Relative 1 %   Basophils Absolute 0.1 0.0 - 0.1 K/uL   Immature Granulocytes 0 %   Abs Immature Granulocytes 0.03 0.00 - 0.07 K/uL    Comment: Performed at [facility], [address], [place], [place] [address]  Comprehensive metabolic panel     Status: Abnormal   Collection Time: 05/18/21  6:40 AM  Result Value Ref Range   Sodium 137 135 - 145 mmol/L   Potassium 4.2 3.5 - 5.1 mmol/L   Chloride 103 98 - 111 mmol/L   CO2 27 22 - 32 mmol/L   Glucose, Bld 106 (H) 70 - 99 mg/dL    Comment: Glucose reference range applies only to samples taken after fasting for at least 8 hours.   BUN 23 8 - 23 mg/dL   Creatinine,  Ser 1.09 (H) 0.44 - 1.00 mg/dL   Calcium 8.8 (L) 8.9 - 10.3 mg/dL   Total Protein 7.3 6.5 - 8.1 g/dL   Albumin 3.7 3.5 - 5.0 g/dL   AST 20 15 - 41 U/L   ALT 21 0 - 44 U/L   Alkaline Phosphatase 68 38 - 126 U/L   Total Bilirubin 0.5 0.3 - 1.2 mg/dL   GFR, Estimated 47 (L) >60 mL/min    Comment: (NOTE) Calculated using the CKD-EPI Creatinine Equation (2021)    Anion gap 7 5 - 15    Comment: Performed at [facility], [address] [place], [place] [address]  Resp Panel by RT-PCR (Flu A&B, Covid) Nasopharyngeal Swab     Status: None   Collection Time: 05/18/21 10:35 AM   Specimen: Nasopharyngeal Swab; Nasopharyngeal(NP) swabs in vial transport medium  Result Value Ref Range   SARS Coronavirus 2 by RT PCR NEGATIVE NEGATIVE    Comment: (NOTE) SARS-CoV-2 target nucleic acids are NOT DETECTED.  The SARS-CoV-2 RNA is generally detectable in upper respiratory specimens during the acute phase of infection. The lowest concentration of SARS-CoV-2 viral copies this assay can detect is 138 copies/mL. A negative result does not preclude SARS-Cov-2 infection and should not be used as the sole basis for treatment or other patient management decisions. A negative result may occur with  improper specimen collection/handling, submission of  specimen other than nasopharyngeal swab, presence of viral mutation(s) within the areas targeted by this assay, and inadequate number of viral copies(<138 copies/mL). A negative result must be combined with clinical observations, patient history, and epidemiological information. The expected result is Negative.  Fact Sheet for Patients:  BloggerCourse.comhttps://www.fda.gov/media/152166/download  Fact Sheet for Healthcare Providers:  SeriousBroker.ithttps://www.fda.gov/media/152162/download  This test is no t yet approved or cleared by the [place] FDA and  has been authorized for detection and/or diagnosis of SARS-CoV-2 by FDA under an Emergency Use Authorization (EUA). This EUA will remain  in effect (meaning this test can be used) for the duration of the COVID-19 declaration under Section 564(b)(1) of the Act, 21 U.S.C.section 360bbb-3(b)(1), unless the authorization is terminated  or revoked sooner.       Influenza A by PCR NEGATIVE NEGATIVE   Influenza B by PCR NEGATIVE NEGATIVE    Comment: (NOTE) The Xpert Xpress SARS-CoV-2/FLU/RSV plus assay is intended as an aid in the diagnosis of influenza from Nasopharyngeal swab specimens and should not be used as a sole basis for treatment. Nasal washings and aspirates are unacceptable for Xpert Xpress SARS-CoV-2/FLU/RSV testing.  Fact Sheet for Patients: BloggerCourse.comhttps://www.fda.gov/media/152166/download  Fact Sheet for Healthcare Providers: SeriousBroker.ithttps://www.fda.gov/media/152162/download  This test is not yet approved or cleared by the [place] FDA and has been authorized for detection and/or diagnosis of SARS-CoV-2 by FDA under an Emergency Use Authorization (EUA). This EUA will remain in effect (meaning this test can be used) for the duration of the COVID-19 declaration under Section 564(b)(1) of the Act, 21 U.S.C. section 360bbb-3(b)(1), unless the authorization is terminated or revoked.  Performed at [facility], [address] [place], [place]  [id number]     Studies/Results:  HEAD CT IMPRESSION: 1. Loss of the normal gray-white matter differentiation in the right superior frontal gyrus, concerning for acute to subacute infarct. No acute intracranial hemorrhage. 2. 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 3. No acute maxillofacial fracture. 4. No acute cervical spine fracture or traumatic listhesis. Advanced multilevel degenerative changes.      BRAIN MRI MRA IMPRESSION: MRI brain:  1. Prematurely terminated, significantly motion degraded and limited examination as described. 2. 3.8 x 0.9 cm acute/subacute cortical and subcortical infarct within the right superior frontal gyrus (right ACA vascular territory). 3. Chronic right thalamic lacunar infarct. 4. Incompletely assessed chronic small vessel ischemic disease within the cerebral white matter and pons. 5. Chronic infarcts within the bilateral cerebellar hemispheres. 6. Mild generalized parenchymal atrophy.   MRA head:   1. The examination is moderate to severely motion degraded. This significantly limits evaluation for vessel occlusions. This also precludes adequate evaluation for intracranial arterial stenoses and aneurysms. 2. No definite anterior circulation proximal large vessel occlusion is identified. However, motion degradation precludes adequate evaluation of the anterior cerebral arteries beginning at the A2/A3 junction. CT angiography may be obtained for further evaluation, as clinically warranted. 3. The intracranial left vertebral artery is poorly delineated, and patency of this vessel cannot be established. 4. Sites of moderate stenosis within the P2 left PCA.         Sherea Liptak A. Gerilyn Pilgrim, M.D.  Diplomate, Biomedical engineer of Psychiatry and Neurology ( Neurology). 05/18/2021, 6:35 PM

## 2021-05-18 NOTE — ED Notes (Signed)
Dr. Rubin Payor at bedside to update patient and family at bedside

## 2021-05-18 NOTE — ED Provider Notes (Signed)
  Physical Exam  BP (!) 161/106   Pulse 91   Temp 98 F (36.7 C) (Oral)   Resp (!) 21   Ht 5\' 8"  (1.727 m)   Wt 77.1 kg   SpO2 95%   BMI 25.85 kg/m   Physical Exam  ED Course/Procedures     Procedures  MDM  Received patient in signout.  Does have history of dementia.  Reportedly had a fall for generalized weakness.  However CT scan showed some potential abnormalities.  MRI showed acute to subacute stroke.  Not a tPA candidate due to time of onset.  Will discuss with hospitalist for admission       , MD 05/18/21 1024

## 2021-05-18 NOTE — ED Notes (Signed)
Patient has urinary incontinence. Brief changed and provided with perineum care and new brief at this time.

## 2021-05-18 NOTE — ED Provider Notes (Signed)
[facility] EMERGENCY DEPARTMENT Provider Note   CSN: [id number] Arrival date & time: 05/18/21  0547     History Chief Complaint  Patient presents with   Marletta Lor    Debra Pratt is a 87 y.o. female.   Fall This is a new problem. The current episode started 1 to 2 hours ago. The problem occurs constantly. The problem has not changed since onset.Associated symptoms include headaches. Pertinent negatives include no chest pain, no abdominal pain and no shortness of breath. Nothing aggravates the symptoms.      Past Medical History:  Diagnosis Date   Dementia (HCC)     There are no problems to display for this patient.   History reviewed. No pertinent surgical history.   OB History   No obstetric history on file.     History reviewed. No pertinent family history.  Social History   Tobacco Use   Smoking status: Unknown  Substance Use Topics   Alcohol use: Not Currently   Drug use: Not Currently    Home Medications Prior to Admission medications   Not on File    Allergies    Patient has no known allergies.  Review of Systems   Review of Systems  Respiratory:  Negative for shortness of breath.   Cardiovascular:  Negative for chest pain.  Gastrointestinal:  Negative for abdominal pain.  Neurological:  Positive for headaches.  All other systems reviewed and are negative.  Physical Exam Updated Vital Signs BP (!) 159/97   Pulse 94   Temp 98 F (36.7 C) (Oral)   Resp 17   Ht 5\' 8"  (1.727 m)   Wt 77.1 kg   SpO2 98%   BMI 25.85 kg/m   Physical Exam Vitals and nursing note reviewed.  Constitutional:      Appearance: She is well-developed.  HENT:     Head: Normocephalic.     Comments: 4 cm laceration to forehead, well approximated, hemostatic.    Nose: No congestion or rhinorrhea.     Mouth/Throat:     Mouth: Mucous membranes are moist.     Pharynx: Oropharynx is clear.  Eyes:     Pupils: Pupils are equal, round, and reactive to light.   Cardiovascular:     Rate and Rhythm: Normal rate and regular rhythm.  Pulmonary:     Effort: Pulmonary effort is normal. No respiratory distress.     Breath sounds: No stridor.  Abdominal:     General: Abdomen is flat. There is no distension.  Musculoskeletal:        General: No swelling or tenderness. Normal range of motion.     Cervical back: Normal range of motion.  Skin:    General: Skin is warm and dry.  Neurological:     General: No focal deficit present.     Mental Status: She is alert.    ED Results / Procedures / Treatments   Labs (all labs ordered are listed, but only abnormal results are displayed) Labs Reviewed  CBC WITH DIFFERENTIAL/PLATELET  COMPREHENSIVE METABOLIC PANEL  URINALYSIS, ROUTINE W REFLEX MICROSCOPIC    EKG None  Radiology No results found.  Procedures Procedures   Medications Ordered in ED Medications - No data to display  ED Course  I have reviewed the triage vital signs and the nursing notes.  Pertinent labs & imaging results that were available during my care of the patient were reviewed by me and considered in my medical decision making (see chart for details).  MDM Rules/Calculators/A&P                           Unwitnessed fall with dementia but states it was generalized LE weakness. Laceration to forehead should be amenable to steristrips. Will eval for traumatic injury and obvious causes for weakness.   Ct w/ possible stroke. Mri ordered. Care transferred pending same.   Final Clinical Impression(s) / ED Diagnoses Final diagnoses:  None    Rx / DC Orders ED Discharge Orders     None        Temesha Queener, Barbara Cower, MD 05/19/21 [phone]

## 2021-05-19 ENCOUNTER — Observation Stay (HOSPITAL_COMMUNITY): Payer: Medicare Other

## 2021-05-19 ENCOUNTER — Observation Stay (HOSPITAL_BASED_OUTPATIENT_CLINIC_OR_DEPARTMENT_OTHER): Payer: Medicare Other

## 2021-05-19 DIAGNOSIS — S0181XA Laceration without foreign body of other part of head, initial encounter: Secondary | ICD-10-CM | POA: Diagnosis not present

## 2021-05-19 DIAGNOSIS — I639 Cerebral infarction, unspecified: Secondary | ICD-10-CM | POA: Diagnosis not present

## 2021-05-19 DIAGNOSIS — J45909 Unspecified asthma, uncomplicated: Secondary | ICD-10-CM | POA: Diagnosis not present

## 2021-05-19 DIAGNOSIS — I6389 Other cerebral infarction: Secondary | ICD-10-CM | POA: Diagnosis not present

## 2021-05-19 DIAGNOSIS — F0390 Unspecified dementia without behavioral disturbance: Secondary | ICD-10-CM | POA: Diagnosis not present

## 2021-05-19 LAB — ECHOCARDIOGRAM COMPLETE
AR max vel: 2.52 cm2
AV Area VTI: 2.85 cm2
AV Area mean vel: 2.63 cm2
AV Mean grad: 5 mmHg
AV Peak grad: 8.6 mmHg
Ao pk vel: 1.47 m/s
Area-P 1/2: 4.39 cm2
Height: 68 in
MV VTI: 3.86 cm2
S' Lateral: 2.32 cm
Weight: 2720 oz

## 2021-05-19 LAB — HEMOGLOBIN A1C
Hgb A1c MFr Bld: 5.8 % — ABNORMAL HIGH (ref 4.8–5.6)
Mean Plasma Glucose: 119.76 mg/dL

## 2021-05-19 LAB — LIPID PANEL
Cholesterol: 269 mg/dL — ABNORMAL HIGH (ref 0–200)
HDL: 62 mg/dL (ref >40)
LDL Cholesterol: 189 mg/dL — ABNORMAL HIGH (ref 0–99)
Total CHOL/HDL Ratio: 4.3 RATIO
Triglycerides: 88 mg/dL (ref <150)
VLDL: 18 mg/dL (ref 0–40)

## 2021-05-19 MED ORDER — ATORVASTATIN CALCIUM 40 MG PO TABS: 40.0000 mg | ORAL_TABLET | Freq: Every day | ORAL | 11 refills | Status: DC

## 2021-05-19 MED ORDER — IOHEXOL 350 MG/ML SOLN: 75.0000 mL | Freq: Once | INTRAVENOUS | Status: DC | PRN

## 2021-05-19 MED ORDER — ASPIRIN EC 81 MG PO TBEC: 81.0000 mg | DELAYED_RELEASE_TABLET | Freq: Every day | ORAL | 2 refills | Status: DC

## 2021-05-19 MED ORDER — CLOPIDOGREL BISULFATE 75 MG PO TABS: 75.0000 mg | ORAL_TABLET | Freq: Every day | ORAL | 0 refills | Status: DC

## 2021-05-19 MED ORDER — AMLODIPINE BESYLATE 2.5 MG PO TABS: 2.5000 mg | ORAL_TABLET | Freq: Every day | ORAL | 11 refills | Status: DC

## 2021-05-19 NOTE — NC FL2 (Signed)
[facility] MEDICAID FL2 LEVEL OF CARE SCREENING TOOL     IDENTIFICATION  Patient Name: Debra Pratt Birthdate: Sep 06, 1925 Sex: female Admission Date (Current Location): 05/18/2021  [facility] and [place] Number:  [employer] and Address:  [facility],  [address], [place] [address]      Provider Number: [id number]  Attending Physician Name and Address:  Erick Blinks, MD  Relative Name and Phone Number:  Hadessah Grennan [phone]    Current Level of Care: Hospital Recommended Level of Care: Assisted Living Facility Prior Approval Number:    Date Approved/Denied:   PASRR Number:    Discharge Plan: Domiciliary (Rest home)    Current Diagnoses: Patient Active Problem List   Diagnosis Date Noted   Stroke (cerebrum) (HCC) 05/18/2021   Dementia without behavioral disturbance (HCC) 05/18/2021   Frequent falls 05/18/2021   Forehead laceration 05/18/2021   Fall 05/18/2021   Asthma 05/18/2021   Hard of hearing 05/18/2021    Orientation RESPIRATION BLADDER Height & Weight     Self, Time, Situation, Place  Normal Incontinent Weight: 77.1 kg Height:  5\' 8"  (172.7 cm)  BEHAVIORAL SYMPTOMS/MOOD NEUROLOGICAL BOWEL NUTRITION STATUS      Continent Diet (Regular)  AMBULATORY STATUS COMMUNICATION OF NEEDS Skin   Extensive Assist Verbally Skin abrasions                       Personal Care Assistance Level of Assistance  Bathing, Feeding, Dressing Bathing Assistance: Maximum assistance Feeding assistance: Limited assistance Dressing Assistance: Maximum assistance     Functional Limitations Info  Sight, Hearing, Speech Sight Info: Adequate Hearing Info: Impaired Speech Info: Adequate    SPECIAL CARE FACTORS FREQUENCY                       Contractures Contractures Info: Not present    Additional Factors Info  Code Status, Allergies Code Status Info: DNR Allergies Info: NKDA           Current Medications (05/19/2021):  This  is the current hospital active medication list Current Facility-Administered Medications  Medication Dose Route Frequency Provider Last Rate Last Admin   0.9 %  sodium chloride infusion   Intravenous Continuous 05/21/2021, MD 50 mL/hr at 05/18/21 1822 New Bag at 05/18/21 1822   acetaminophen (TYLENOL) tablet 650 mg  650 mg Oral Q4H PRN 05/20/21, MD   650 mg at 05/18/21 1823   Or   acetaminophen (TYLENOL) 160 MG/5ML solution 650 mg  650 mg Per Tube Q4H PRN 05/20/21, MD       Or   acetaminophen (TYLENOL) suppository 650 mg  650 mg Rectal Q4H PRN Erick Blinks, MD       aspirin suppository 300 mg  300 mg Rectal Daily Erick Blinks, MD       Or   aspirin tablet 325 mg  325 mg Oral Daily Erick Blinks, MD   325 mg at 05/18/21 1825   clopidogrel (PLAVIX) tablet 75 mg  75 mg Oral Q breakfast [place], Kofi, MD       enoxaparin (LOVENOX) injection 40 mg  40 mg Subcutaneous Q24H Memon, 05/20/21, MD   40 mg at 05/18/21 2304   iohexol (OMNIPAQUE) 350 MG/ML injection 75 mL  75 mL Intravenous Once PRN 05/20/21, MD       MEDLINE mouth rinse  15 mL Mouth Rinse BID Beryle Beams, MD   15 mL at 05/19/21 1034  mometasone-formoterol (DULERA) 100-5 MCG/ACT inhaler 2 puff  2 puff Inhalation BID Erick Blinks, MD   2 puff at 05/19/21 0801   senna-docusate (Senokot-S) tablet 1 tablet  1 tablet Oral QHS PRN Erick Blinks, MD       vitamin B-12 (CYANOCOBALAMIN) tablet 500 mcg  500 mcg Oral Daily Erick Blinks, MD         Discharge Medications: Medication List       TAKE these medications     acetaminophen 500 MG tablet Commonly known as: TYLENOL Take 500 mg by mouth every 6 (six) hours as needed for mild pain, fever or headache.    albuterol 108 (90 Base) MCG/ACT inhaler Commonly known as: VENTOLIN HFA Inhale 1-2 puffs into the lungs every 6 (six) hours as needed for wheezing or shortness of breath.    amLODipine 2.5 MG tablet Commonly known as: NORVASC Take 1  tablet (2.5 mg total) by mouth daily.    aspirin EC 81 MG tablet Take 1 tablet (81 mg total) by mouth daily. Swallow whole.    atorvastatin 40 MG tablet Commonly known as: Lipitor Take 1 tablet (40 mg total) by mouth daily.    CALCIUM 500 PO Take 1 tablet by mouth daily.    clopidogrel 75 MG tablet Commonly known as: PLAVIX Take 1 tablet (75 mg total) by mouth daily with breakfast. Start taking on: May 20, 2021    D3 5000 125 MCG (5000 UT) capsule Generic drug: Cholecalciferol Take 5,000 Units by mouth daily.    Symbicort 80-4.5 MCG/ACT inhaler Generic drug: budesonide-formoterol Inhale 2 puffs into the lungs 2 (two) times daily.    vitamin B-12 500 MCG tablet Commonly known as: CYANOCOBALAMIN Take 500 mcg by mouth daily.           Relevant Imaging Results:  Relevant Lab Results:   Additional Information SS# [id number]  Leitha Bleak, RN

## 2021-05-19 NOTE — TOC Transition Note (Addendum)
Transition of Care [facility]) - CM/SW Discharge Note   Patient Details  Name: Debra Pratt MRN: [id number] Date of Birth: [birth year]  [facility] CM/SW Contact:  Leitha Bleak, RN Phone Number: 05/19/2021, 2:24 PM   Clinical Narrative:   Patient medically ready to discharge. TOC called [facility] with update, they are requesting new FL2 for length of stay. FL2 completed and sent with DC summary.  Called EMS to schedule transport. Updated family with DC plan.  Per Chip Boer patient will not want or participate with home health.   Final next level of care: Assisted Living Barriers to Discharge: Barriers Resolved   Patient Goals and CMS Choice Patient states their goals for this hospitalization and ongoing recovery are:: to return to ALF. CMS Medicare.gov Compare Post Acute Care list provided to:: Patient Represenative (must comment) Choice offered to / list presented to : [facility] POA / Guardian  Discharge Placement              Patient chooses bed at:  [facility]F) Patient to be transferred to facility by: EMS Name of family member notified: [place] [facility] Patient and family notified of of transfer: 05/19/21  Discharge Plan and Services     [facility]

## 2021-05-19 NOTE — Care Management Obs Status (Signed)
MEDICARE OBSERVATION STATUS NOTIFICATION   Patient Details  Name: Debra Pratt MRN: [id number] Date of Birth: 1925-05-05   Medicare Observation Status Notification Given:  Yes (copy mailed (certified) to address on file)    Corey Harold 05/19/2021, 2:40 PM

## 2021-05-19 NOTE — Discharge Summary (Signed)
Physician Discharge Summary  Amesha Bailey SFK:[id number] DOB: September 20, 1925 DOA: 05/18/2021  PCP: Patient, No Pcp Per (Inactive)  Admit date: 05/18/2021 Discharge date: 05/19/2021  Admitted From: ALF Disposition:  ALF  Recommendations for Outpatient Follow-up:  Follow up with PCP in 1-2 weeks Please obtain BMP/CBC in one week Recommend palliative care services to evaluate patient as outpatient to discuss further goals of care Remove steri strips on forehead in 10-14 days   Home Health:PT/OT/SLP Equipment/Devices:  Discharge Condition:stable CODE STATUS:DNR Diet recommendation: heart healthy  Brief/Interim Summary:  Rene Gonsoulin is a 87 y.o. female with medical history significant of dementia, asthma, frequent falls.  Patient's daughter reports that she is a resident of ALF and was previously ambulating with a walker/cane, but due to recurrent falls and safety concerns, she has been mostly in a wheelchair.  Her daughter thinks that patient try to get up on her own today without any assistance and her legs gave out causing her to fall.  Patient does admit to hitting her head on her bed rail.  She denies any loss of consciousness.  Otherwise history is limited since patient is so hard of hearing and may have some confusion.   ED Course: Basic imaging was unrevealing for any deep bony injuries including x-ray pelvis, chest x-ray and CT scans of her head face and C-spine.  There was concern for possible stroke on CT therefore MRI brain was performed that indicated possible CVA.  She has been referred for admission for further work-up.  Discharge Diagnoses:  Active Problems:   Stroke (cerebrum) (HCC)   Dementia without behavioral disturbance (HCC)   Frequent falls   Forehead laceration   Fall   Asthma   Hard of hearing  Acute CVA -MRI brain confirms CVA -She is not on any antiplatelet medications as an outpatient -seen by neurology with recommendations for DAPT for 4 weeks followed by  aspirin -CTA head and neck were ordered to evaluate vasculature, but patient is refusing to have this done -echocardiogram unremarkable -Physical therapy/Occupational Therapy/speech therapy evaluated patient and recommended SNF -discussed with patient's ALF who feel they can provide the appropriate level of care -LDL 189, started on statin -A1c 5.8 -due to advanced age and her cognitive impairments, I think it is reasonable to pursue conservative approach. She does not wish to undergo further work up in the hospital including CTA head and neck/carotid dopplers. Will continue on current therapy  -would recommend palliative care services as outpatient to further address goals of care    Forehead laceration secondary to fall -No deep traumatic injuries -Repaired in ED with Steri-Strips -These can be removed in the next 10 to 14 days   Asthma -Appears stable at this time -Continue on Singulair   Dementia -Does not appear to have behavioral disturbances at present  Discharge Instructions  Discharge Instructions     Diet - low sodium heart healthy   Complete by: As directed    Discharge wound care:   Complete by: As directed    Keep incision clean and dry. Can remove steri strips in 10-14 days   Increase activity slowly   Complete by: As directed       Allergies as of 05/19/2021   No Known Allergies      Medication List     TAKE these medications    acetaminophen 500 MG tablet Commonly known as: TYLENOL Take 500 mg by mouth every 6 (six) hours as needed for mild pain, fever or headache.   albuterol  108 (90 Base) MCG/ACT inhaler Commonly known as: VENTOLIN HFA Inhale 1-2 puffs into the lungs every 6 (six) hours as needed for wheezing or shortness of breath.   amLODipine 2.5 MG tablet Commonly known as: NORVASC Take 1 tablet (2.5 mg total) by mouth daily.   aspirin EC 81 MG tablet Take 1 tablet (81 mg total) by mouth daily. Swallow whole.   atorvastatin 40 MG  tablet Commonly known as: Lipitor Take 1 tablet (40 mg total) by mouth daily.   CALCIUM 500 PO Take 1 tablet by mouth daily.   clopidogrel 75 MG tablet Commonly known as: PLAVIX Take 1 tablet (75 mg total) by mouth daily with breakfast. Start taking on: May 20, 2021   D3 5000 125 MCG (5000 UT) capsule Generic drug: Cholecalciferol Take 5,000 Units by mouth daily.   Symbicort 80-4.5 MCG/ACT inhaler Generic drug: budesonide-formoterol Inhale 2 puffs into the lungs 2 (two) times daily.   vitamin B-12 500 MCG tablet Commonly known as: CYANOCOBALAMIN Take 500 mcg by mouth daily.               Discharge Care Instructions  (From admission, onward)           Start     Ordered   05/19/21 0000  Discharge wound care:       Comments: Keep incision clean and dry. Can remove steri strips in 10-14 days   05/19/21 1409            No Known Allergies  Consultations: neurology   Procedures/Studies: DG Chest 1 View  Result Date: 05/18/2021 CLINICAL DATA:  Fall. EXAM: CHEST  1 VIEW COMPARISON:  None. FINDINGS: The heart size and mediastinal contours are within normal limits. Both lungs are clear. The visualized skeletal structures are unremarkable. IMPRESSION: No active disease. Electronically Signed   By: Obie Dredge M.D.   On: 05/18/2021 06:36   DG Pelvis 1-2 Views  Result Date: 05/18/2021 CLINICAL DATA:  Fall. EXAM: PELVIS - 1-2 VIEW COMPARISON:  None. FINDINGS: There is no evidence of pelvic fracture or diastasis. No pelvic bone lesions are seen. Moderate to severe left and mild-to-moderate right hip osteoarthritis. IMPRESSION: 1. No acute osseous abnormality. Electronically Signed   By: Obie Dredge M.D.   On: 05/18/2021 06:37   CT HEAD WO CONTRAST ( )  Result Date: 05/18/2021 CLINICAL DATA:  Fall. EXAM: CT HEAD WITHOUT CONTRAST CT MAXILLOFACIAL WITHOUT CONTRAST CT CERVICAL SPINE WITHOUT CONTRAST TECHNIQUE: Multidetector CT imaging of the head, cervical  spine, and maxillofacial structures were performed using the standard protocol without intravenous contrast. Multiplanar CT image reconstructions of the cervical spine and maxillofacial structures were also generated. COMPARISON:  None. FINDINGS: CT HEAD FINDINGS Brain: Loss of the normal gray-white matter differentiation in the right superior frontal gyrus. No evidence of hemorrhage, hydrocephalus, extra-axial collection or mass lesion/mass effect. 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 Moderate cerebral atrophy and chronic microvascular ischemic changes. Vascular: Calcified atherosclerosis at the skullbase. No hyperdense vessel. Skull: Normal. Negative for fracture or focal lesion. Other: None. CT MAXILLOFACIAL FINDINGS Osseous: No fracture or mandibular dislocation. No destructive process. Orbits: Negative. No traumatic or inflammatory finding. Sinuses: Clear. Soft tissues: Negative. CT CERVICAL SPINE FINDINGS Alignment: No traumatic malalignment. Mild facet mediated anterolisthesis at C3-C4 and C7-T1. Skull base and vertebrae: No acute fracture. No primary bone lesion or focal pathologic process. Soft tissues and spinal canal: No prevertebral fluid or swelling. No visible canal hematoma. Disc levels: Advanced multilevel disc height loss and facet  uncovertebral hypertrophy. Upper chest: Biapical pleuroparenchymal scarring. Other: None. IMPRESSION: 1. Loss of the normal gray-white matter differentiation in the right superior frontal gyrus, concerning for acute to subacute infarct. No acute intracranial hemorrhage. 2. 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 3. No acute maxillofacial fracture. 4. No acute cervical spine fracture or traumatic listhesis. Advanced multilevel degenerative changes. Electronically Signed   By: Obie Dredge M.D.   On: 05/18/2021 06:46   CT Cervical Spine Wo Contrast  Result Date: 05/18/2021 CLINICAL DATA:  Fall. EXAM: CT HEAD WITHOUT CONTRAST CT MAXILLOFACIAL  WITHOUT CONTRAST CT CERVICAL SPINE WITHOUT CONTRAST TECHNIQUE: Multidetector CT imaging of the head, cervical spine, and maxillofacial structures were performed using the standard protocol without intravenous contrast. Multiplanar CT image reconstructions of the cervical spine and maxillofacial structures were also generated. COMPARISON:  None. FINDINGS: CT HEAD FINDINGS Brain: Loss of the normal gray-white matter differentiation in the right superior frontal gyrus. No evidence of hemorrhage, hydrocephalus, extra-axial collection or mass lesion/mass effect. 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 Moderate cerebral atrophy and chronic microvascular ischemic changes. Vascular: Calcified atherosclerosis at the skullbase. No hyperdense vessel. Skull: Normal. Negative for fracture or focal lesion. Other: None. CT MAXILLOFACIAL FINDINGS Osseous: No fracture or mandibular dislocation. No destructive process. Orbits: Negative. No traumatic or inflammatory finding. Sinuses: Clear. Soft tissues: Negative. CT CERVICAL SPINE FINDINGS Alignment: No traumatic malalignment. Mild facet mediated anterolisthesis at C3-C4 and C7-T1. Skull base and vertebrae: No acute fracture. No primary bone lesion or focal pathologic process. Soft tissues and spinal canal: No prevertebral fluid or swelling. No visible canal hematoma. Disc levels: Advanced multilevel disc height loss and facet uncovertebral hypertrophy. Upper chest: Biapical pleuroparenchymal scarring. Other: None. IMPRESSION: 1. Loss of the normal gray-white matter differentiation in the right superior frontal gyrus, concerning for acute to subacute infarct. No acute intracranial hemorrhage. 2. 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 3. No acute maxillofacial fracture. 4. No acute cervical spine fracture or traumatic listhesis. Advanced multilevel degenerative changes. Electronically Signed   By: Obie Dredge M.D.   On: 05/18/2021 06:46   MR ANGIO HEAD WO  CONTRAST  Result Date: 05/18/2021 CLINICAL DATA:  Neuro deficit, acute, stroke suspected. Additional history provided: Fall, forehead laceration, altered mental status. EXAM: MRI HEAD WITHOUT CONTRAST MRA HEAD WITHOUT CONTRAST TECHNIQUE: Multiplanar, multi-echo pulse sequences of the brain and surrounding structures were acquired without intravenous contrast. Angiographic images of the Circle of Willis were acquired using MRA technique without intravenous contrast. COMPARISON:  CT head/maxillofacial performed earlier today 05/18/2021. FINDINGS: MRI HEAD FINDINGS Brain: The patient was unable to tolerate the full examination. As a result, only axial and coronal diffusion-weighted sequences, a sagittal T1 weighted sequence, axial T2 TSE sequence and an axial T2 GRE sequence could be obtained. The acquired imaging is motion degraded. Most notably, there is moderate motion degradation of the coronal diffusion-weighted sequence, severe motion degradation of the sagittal T1 weighted sequence, severe motion degradation of the axial T2 TSE sequence and severe motion degradation of the axial T2 GRE sequence. Mild generalized cerebral and cerebellar atrophy. There is a focus of cortical/subcortical diffusion weighted signal abnormality within the right superior frontal gyrus measuring 3.8 x 0.9 cm in transaxial dimensions. There are some regions of corresponding hypointense signal on the ADC map, and this finding is consistent with an acute/subacute infarct. Incompletely assessed multifocal T2/FLAIR hyperintensity within the cerebral white matter and pons, nonspecific but compatible with chronic small vessel ischemic disease. Chronic lacunar infarct within the right thalamus.  Chronic infarcts within the bilateral cerebellar hemispheres. Within the limitations of motion degradation, no intracranial mass, chronic intracranial blood products or extra-axial fluid collection is identified. No midline shift. Vascular: Flow voids  poorly assessed due to the degree of motion degradation. Skull and upper cervical spine: Within limitations of motion degradation, no focal suspicious marrow lesion is identified. Sinuses/Orbits: Within limitations of motion degradation, no acute orbital abnormality or significant paranasal sinus disease is identified. MRA HEAD FINDINGS The examination is moderate to severely motion degraded. This significantly limits evaluation for vessel occlusions. This also precludes adequate evaluation for intracranial arterial stenoses and for aneurysms. Anterior circulation: The intracranial internal carotid arteries are patent. The M1 middle cerebral arteries are patent. No M2 proximal branch occlusion is identified. The anterior cerebral arteries are patent proximally. However, motion degradation precludes adequate evaluation of the anterior cerebral arteries beginning at the A2/A3 junction. Posterior circulation: The intracranial left vertebral artery is poorly delineated, and patency of this vessel cannot be established. The intracranial right vertebral artery is patent. The basilar artery is patent. The posterior cerebral arteries are patent proximally. Atherosclerotic irregularity of both vessels. Most notably, there are sites of moderate stenosis within the P2 left PCA. Motion degradation precludes adequate evaluation of the posterior cerebral arteries beyond the P3 segment level. A left posterior communicating artery is present. The right posterior communicating artery is hypoplastic or absent. Anatomic variants: As described IMPRESSION: MRI brain: 1. Prematurely terminated, significantly motion degraded and limited examination as described. 2. 3.8 x 0.9 cm acute/subacute cortical and subcortical infarct within the right superior frontal gyrus (right ACA vascular territory). 3. Chronic right thalamic lacunar infarct. 4. Incompletely assessed chronic small vessel ischemic disease within the cerebral white matter and  pons. 5. Chronic infarcts within the bilateral cerebellar hemispheres. 6. Mild generalized parenchymal atrophy. MRA head: 1. The examination is moderate to severely motion degraded. This significantly limits evaluation for vessel occlusions. This also precludes adequate evaluation for intracranial arterial stenoses and aneurysms. 2. No definite anterior circulation proximal large vessel occlusion is identified. However, motion degradation precludes adequate evaluation of the anterior cerebral arteries beginning at the A2/A3 junction. CT angiography may be obtained for further evaluation, as clinically warranted. 3. The intracranial left vertebral artery is poorly delineated, and patency of this vessel cannot be established. 4. Sites of moderate stenosis within the P2 left PCA. Electronically Signed   By: Jackey Loge D.O.   On: 05/18/2021 08:18   MR BRAIN WO CONTRAST  Result Date: 05/18/2021 CLINICAL DATA:  Neuro deficit, acute, stroke suspected. Additional history provided: Fall, forehead laceration, altered mental status. EXAM: MRI HEAD WITHOUT CONTRAST MRA HEAD WITHOUT CONTRAST TECHNIQUE: Multiplanar, multi-echo pulse sequences of the brain and surrounding structures were acquired without intravenous contrast. Angiographic images of the Circle of Willis were acquired using MRA technique without intravenous contrast. COMPARISON:  CT head/maxillofacial performed earlier today 05/18/2021. FINDINGS: MRI HEAD FINDINGS Brain: The patient was unable to tolerate the full examination. As a result, only axial and coronal diffusion-weighted sequences, a sagittal T1 weighted sequence, axial T2 TSE sequence and an axial T2 GRE sequence could be obtained. The acquired imaging is motion degraded. Most notably, there is moderate motion degradation of the coronal diffusion-weighted sequence, severe motion degradation of the sagittal T1 weighted sequence, severe motion degradation of the axial T2 TSE sequence and severe motion  degradation of the axial T2 GRE sequence. Mild generalized cerebral and cerebellar atrophy. There is a focus of cortical/subcortical diffusion weighted signal abnormality within the  right superior frontal gyrus measuring 3.8 x 0.9 cm in transaxial dimensions. There are some regions of corresponding hypointense signal on the ADC map, and this finding is consistent with an acute/subacute infarct. Incompletely assessed multifocal T2/FLAIR hyperintensity within the cerebral white matter and pons, nonspecific but compatible with chronic small vessel ischemic disease. Chronic lacunar infarct within the right thalamus. Chronic infarcts within the bilateral cerebellar hemispheres. Within the limitations of motion degradation, no intracranial mass, chronic intracranial blood products or extra-axial fluid collection is identified. No midline shift. Vascular: Flow voids poorly assessed due to the degree of motion degradation. Skull and upper cervical spine: Within limitations of motion degradation, no focal suspicious marrow lesion is identified. Sinuses/Orbits: Within limitations of motion degradation, no acute orbital abnormality or significant paranasal sinus disease is identified. MRA HEAD FINDINGS The examination is moderate to severely motion degraded. This significantly limits evaluation for vessel occlusions. This also precludes adequate evaluation for intracranial arterial stenoses and for aneurysms. Anterior circulation: The intracranial internal carotid arteries are patent. The M1 middle cerebral arteries are patent. No M2 proximal branch occlusion is identified. The anterior cerebral arteries are patent proximally. However, motion degradation precludes adequate evaluation of the anterior cerebral arteries beginning at the A2/A3 junction. Posterior circulation: The intracranial left vertebral artery is poorly delineated, and patency of this vessel cannot be established. The intracranial right vertebral artery is  patent. The basilar artery is patent. The posterior cerebral arteries are patent proximally. Atherosclerotic irregularity of both vessels. Most notably, there are sites of moderate stenosis within the P2 left PCA. Motion degradation precludes adequate evaluation of the posterior cerebral arteries beyond the P3 segment level. A left posterior communicating artery is present. The right posterior communicating artery is hypoplastic or absent. Anatomic variants: As described IMPRESSION: MRI brain: 1. Prematurely terminated, significantly motion degraded and limited examination as described. 2. 3.8 x 0.9 cm acute/subacute cortical and subcortical infarct within the right superior frontal gyrus (right ACA vascular territory). 3. Chronic right thalamic lacunar infarct. 4. Incompletely assessed chronic small vessel ischemic disease within the cerebral white matter and pons. 5. Chronic infarcts within the bilateral cerebellar hemispheres. 6. Mild generalized parenchymal atrophy. MRA head: 1. The examination is moderate to severely motion degraded. This significantly limits evaluation for vessel occlusions. This also precludes adequate evaluation for intracranial arterial stenoses and aneurysms. 2. No definite anterior circulation proximal large vessel occlusion is identified. However, motion degradation precludes adequate evaluation of the anterior cerebral arteries beginning at the A2/A3 junction. CT angiography may be obtained for further evaluation, as clinically warranted. 3. The intracranial left vertebral artery is poorly delineated, and patency of this vessel cannot be established. 4. Sites of moderate stenosis within the P2 left PCA. Electronically Signed   By: Jackey Loge D.O.   On: 05/18/2021 08:18   ECHOCARDIOGRAM COMPLETE  Result Date: 05/19/2021    ECHOCARDIOGRAM REPORT   Patient Name:   DARIANA GARBETT Date of Exam: 05/19/2021 Medical Rec #:  [id number]     Height:       68.0 in Accession #:    [id number]     Weight:       170.0 lb Date of Birth:  03-27-1925      BSA:          1.907 m Patient Age:    87 years      BP:           150/64 mmHg Patient Gender: F  HR:           80 bpm. [facility] Procedure: 2D Echo, Cardiac Doppler and Color Doppler Indications:   Stroke  History:       Patient has no prior history of Echocardiogram examinations.                Stroke.  Sonographer:   Mikki Harbor Referring      [phone] [facility] Kirke Breach Phys:  Sonographer Comments: Image acquisition challenging due to patient behavioral factors. IMPRESSIONS  1. Left ventricular ejection fraction, by estimation, is 60 to 65%. The left ventricle has normal function. The left ventricle has no regional wall motion abnormalities. There is mild left ventricular hypertrophy. Left ventricular diastolic parameters are consistent with Grade I diastolic dysfunction (impaired relaxation).  2. Right ventricular systolic function is normal. The right ventricular size is normal. Tricuspid regurgitation signal is inadequate for assessing PA pressure.  3. Left atrial size was upper normal.  4. The mitral valve is degenerative. Trivial mitral valve regurgitation. The mean mitral valve gradient is 3.0 mmHg.  5. The aortic valve is tricuspid. Aortic valve regurgitation is trivial. Mild to moderate aortic valve sclerosis/calcification is present, without any evidence of aortic stenosis. Aortic valve mean gradient measures 5.0 mmHg.  6. The inferior vena cava is normal in size with greater than 50% respiratory variability, suggesting right atrial pressure of 3 mmHg. Comparison(s): No prior Echocardiogram. FINDINGS  Left Ventricle: Left ventricular ejection fraction, by estimation, is 60 to 65%. The left ventricle has normal function. The left ventricle has no regional wall motion abnormalities. The left ventricular internal cavity size was normal in size. There is  mild left ventricular hypertrophy. Left ventricular diastolic parameters are  consistent with Grade I diastolic dysfunction (impaired relaxation). Right Ventricle: The right ventricular size is normal. No increase in right ventricular wall thickness. Right ventricular systolic function is normal. Tricuspid regurgitation signal is inadequate for assessing PA pressure. Left Atrium: Left atrial size was upper normal. Right Atrium: Right atrial size was normal in size. Pericardium: There is no evidence of pericardial effusion. Mitral Valve: The mitral valve is degenerative in appearance. There is mild thickening of the mitral valve leaflet(s). There is mild calcification of the mitral valve leaflet(s). Mild to moderate mitral annular calcification. Trivial mitral valve regurgitation. MV peak gradient, 8.6 mmHg. The mean mitral valve gradient is 3.0 mmHg. Tricuspid Valve: The tricuspid valve is grossly normal. Tricuspid valve regurgitation is trivial. Aortic Valve: The aortic valve is tricuspid. There is mild aortic valve annular calcification. Aortic valve regurgitation is trivial. Mild to moderate aortic valve sclerosis/calcification is present, without any evidence of aortic stenosis. Aortic valve mean gradient measures 5.0 mmHg. Aortic valve peak gradient measures 8.6 mmHg. Aortic valve area, by VTI measures 2.85 cm. Pulmonic Valve: The pulmonic valve was grossly normal. Pulmonic valve regurgitation is trivial. Aorta: The aortic root is normal in size and structure. Venous: The inferior vena cava is normal in size with greater than 50% respiratory variability, suggesting right atrial pressure of 3 mmHg. IAS/Shunts: No atrial level shunt detected by color flow Doppler.  LEFT VENTRICLE PLAX 2D LVIDd:         4.44 cm  Diastology LVIDs:         2.32 cm  LV e' medial:    4.99 cm/s LV PW:         1.01 cm  LV E/e' medial:  11.8 LV IVS:        1.36  cm  LV e' lateral:   6.13 cm/s LVOT diam:     2.10 cm  LV E/e' lateral: 9.6 LV SV:         88 LV SV Index:   46 LVOT Area:     3.46 cm  LEFT ATRIUM              Index       RIGHT ATRIUM           Index LA diam:        4.30 cm 2.25 cm/m  RA Area:     11.90 cm LA Vol (A2C):   39.6 ml 20.76 ml/m RA Volume:   29.70 ml  15.57 ml/m LA Vol (A4C):   62.5 ml 32.77 ml/m LA Biplane Vol: 50.0 ml 26.21 ml/m  AORTIC VALVE AV Area (Vmax):    2.52 cm AV Area (Vmean):   2.63 cm AV Area (VTI):     2.85 cm AV Vmax:           147.00 cm/s AV Vmean:          102.000 cm/s AV VTI:            0.308 m AV Peak Grad:      8.6 mmHg AV Mean Grad:      5.0 mmHg LVOT Vmax:         107.00 cm/s LVOT Vmean:        77.400 cm/s LVOT VTI:          0.253 m LVOT/AV VTI ratio: 0.82 MITRAL VALVE MV Area (PHT): 4.39 cm     SHUNTS MV Area VTI:   3.86 cm     Systemic VTI:  0.25 m MV Peak grad:  8.6 mmHg     Systemic Diam: 2.10 cm MV Mean grad:  3.0 mmHg MV Vmax:       1.47 m/s MV Vmean:      69.7 cm/s MV Decel Time: 173 msec MV E velocity: 58.80 cm/s MV A velocity: 121.00 cm/s MV E/A ratio:  0.49 Nona Dell MD Electronically signed by Nona Dell MD Signature Date/Time: 05/19/2021/12:46:47 PM    Final    CT Maxillofacial Wo Contrast  Result Date: 05/18/2021 CLINICAL DATA:  Fall. EXAM: CT HEAD WITHOUT CONTRAST CT MAXILLOFACIAL WITHOUT CONTRAST CT CERVICAL SPINE WITHOUT CONTRAST TECHNIQUE: Multidetector CT imaging of the head, cervical spine, and maxillofacial structures were performed using the standard protocol without intravenous contrast. Multiplanar CT image reconstructions of the cervical spine and maxillofacial structures were also generated. COMPARISON:  None. FINDINGS: CT HEAD FINDINGS Brain: Loss of the normal gray-white matter differentiation in the right superior frontal gyrus. No evidence of hemorrhage, hydrocephalus, extra-axial collection or mass lesion/mass effect. 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 Moderate cerebral atrophy and chronic microvascular ischemic changes. Vascular: Calcified atherosclerosis at the skullbase. No hyperdense vessel. Skull: Normal.  Negative for fracture or focal lesion. Other: None. CT MAXILLOFACIAL FINDINGS Osseous: No fracture or mandibular dislocation. No destructive process. Orbits: Negative. No traumatic or inflammatory finding. Sinuses: Clear. Soft tissues: Negative. CT CERVICAL SPINE FINDINGS Alignment: No traumatic malalignment. Mild facet mediated anterolisthesis at C3-C4 and C7-T1. Skull base and vertebrae: No acute fracture. No primary bone lesion or focal pathologic process. Soft tissues and spinal canal: No prevertebral fluid or swelling. No visible canal hematoma. Disc levels: Advanced multilevel disc height loss and facet uncovertebral hypertrophy. Upper chest: Biapical pleuroparenchymal scarring. Other: None. IMPRESSION: 1. Loss of the normal gray-white matter differentiation in  the right superior frontal gyrus, concerning for acute to subacute infarct. No acute intracranial hemorrhage. 2. 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 3. No acute maxillofacial fracture. 4. No acute cervical spine fracture or traumatic listhesis. Advanced multilevel degenerative changes. Electronically Signed   By: Obie DredgeWilliam T Derry M.D.   On: 05/18/2021 06:46      Subjective: She is refusing med and further work up. Speech is mildly dysarthric  Discharge Exam: Vitals:   05/19/21 0201 05/19/21 0533 05/19/21 0804 05/19/21 1354  BP: (!) 144/64 (!) 150/64  (!) 142/74  Pulse: 76 80  100  Resp: 19   20  Temp: 97.9 F (36.6 C) 97.9 F (36.6 C)  98.6 F (37 C)  TempSrc: Oral Oral    SpO2: 96% 96% 97% 98%  Weight:      Height:        General: Pt is alert, awake, not in acute distress Cardiovascular: RRR, S1/S2 +, no rubs, no gallops Respiratory: CTA bilaterally, no wheezing, no rhonchi Abdominal: Soft, NT, ND, bowel sounds + Extremities: no edema, no cyanosis    The results of significant diagnostics from this hospitalization (including imaging, microbiology, ancillary and laboratory) are listed below for reference.      Microbiology: Recent Results (from the past 240 hour(s))  Resp Panel by RT-PCR (Flu A&B, Covid) Nasopharyngeal Swab     Status: None   Collection Time: 05/18/21 10:35 AM   Specimen: Nasopharyngeal Swab; Nasopharyngeal(NP) swabs in vial transport medium  Result Value Ref Range Status   SARS Coronavirus 2 by RT PCR NEGATIVE NEGATIVE Final    Comment: (NOTE) SARS-CoV-2 target nucleic acids are NOT DETECTED.  The SARS-CoV-2 RNA is generally detectable in upper respiratory specimens during the acute phase of infection. The lowest concentration of SARS-CoV-2 viral copies this assay can detect is 138 copies/mL. A negative result does not preclude SARS-Cov-2 infection and should not be used as the sole basis for treatment or other patient management decisions. A negative result may occur with  improper specimen collection/handling, submission of specimen other than nasopharyngeal swab, presence of viral mutation(s) within the areas targeted by this assay, and inadequate number of viral copies(<138 copies/mL). A negative result must be combined with clinical observations, patient history, and epidemiological information. The expected result is Negative.  Fact Sheet for Patients:  BloggerCourse.comhttps://www.fda.gov/media/152166/download  Fact Sheet for Healthcare Providers:  SeriousBroker.ithttps://www.fda.gov/media/152162/download  This test is no t yet approved or cleared by the [place] FDA and  has been authorized for detection and/or diagnosis of SARS-CoV-2 by FDA under an Emergency Use Authorization (EUA). This EUA will remain  in effect (meaning this test can be used) for the duration of the COVID-19 declaration under Section 564(b)(1) of the Act, 21 U.S.C.section 360bbb-3(b)(1), unless the authorization is terminated  or revoked sooner.       Influenza A by PCR NEGATIVE NEGATIVE Final   Influenza B by PCR NEGATIVE NEGATIVE Final    Comment: (NOTE) The Xpert Xpress SARS-CoV-2/FLU/RSV plus assay is  intended as an aid in the diagnosis of influenza from Nasopharyngeal swab specimens and should not be used as a sole basis for treatment. Nasal washings and aspirates are unacceptable for Xpert Xpress SARS-CoV-2/FLU/RSV testing.  Fact Sheet for Patients: BloggerCourse.comhttps://www.fda.gov/media/152166/download  Fact Sheet for Healthcare Providers: SeriousBroker.ithttps://www.fda.gov/media/152162/download  This test is not yet approved or cleared by the [place] FDA and has been authorized for detection and/or diagnosis of SARS-CoV-2 by FDA under an Emergency Use Authorization (EUA). This EUA will remain in  effect (meaning this test can be used) for the duration of the COVID-19 declaration under Section 564(b)(1) of the Act, 21 U.S.C. section 360bbb-3(b)(1), unless the authorization is terminated or revoked.  Performed at [facility], [address], [place], [place] [address]      Labs: BNP (last 3 results) No results for input(s): BNP in the last 8760 hours. Basic Metabolic Panel: Recent Labs  Lab 05/18/21 0640  NA 137  K 4.2  CL 103  CO2 27  GLUCOSE 106*  BUN 23  CREATININE 1.09*  CALCIUM 8.8*   Liver Function Tests: Recent Labs  Lab 05/18/21 0640  AST 20  ALT 21  ALKPHOS 68  BILITOT 0.5  PROT 7.3  ALBUMIN 3.7   No results for input(s): LIPASE, AMYLASE in the last 168 hours. No results for input(s): AMMONIA in the last 168 hours. CBC: Recent Labs  Lab 05/18/21 0640  WBC 10.3  NEUTROABS 6.9  HGB 12.3  HCT 39.3  MCV 92.3  PLT 243   Cardiac Enzymes: No results for input(s): CKTOTAL, CKMB, CKMBINDEX, TROPONINI in the last 168 hours. BNP: Invalid input(s): POCBNP CBG: No results for input(s): GLUCAP in the last 168 hours. D-Dimer No results for input(s): DDIMER in the last 72 hours. Hgb A1c Recent Labs    05/19/21 0542  HGBA1C 5.8*   Lipid Profile Recent Labs    05/19/21 0542  CHOL 269*  HDL 62  LDLCALC 189*  TRIG 88  CHOLHDL 4.3   Thyroid function  studies No results for input(s): TSH, T4TOTAL, T3FREE, THYROIDAB in the last 72 hours.  Invalid input(s): FREET3 Anemia work up No results for input(s): VITAMINB12, FOLATE, FERRITIN, TIBC, IRON, RETICCTPCT in the last 72 hours. Urinalysis    Component Value Date/Time   COLORURINE STRAW (A) 05/18/2021 0557   APPEARANCEUR CLEAR 05/18/2021 0557   LABSPEC 1.008 05/18/2021 0557   PHURINE 7.0 05/18/2021 0557   GLUCOSEU NEGATIVE 05/18/2021 0557   HGBUR NEGATIVE 05/18/2021 0557   BILIRUBINUR NEGATIVE 05/18/2021 0557   KETONESUR NEGATIVE 05/18/2021 0557   PROTEINUR NEGATIVE 05/18/2021 0557   NITRITE NEGATIVE 05/18/2021 0557   LEUKOCYTESUR NEGATIVE 05/18/2021 0557   Sepsis Labs Invalid input(s): PROCALCITONIN,  WBC,  LACTICIDVEN Microbiology Recent Results (from the past 240 hour(s))  Resp Panel by RT-PCR (Flu A&B, Covid) Nasopharyngeal Swab     Status: None   Collection Time: 05/18/21 10:35 AM   Specimen: Nasopharyngeal Swab; Nasopharyngeal(NP) swabs in vial transport medium  Result Value Ref Range Status   SARS Coronavirus 2 by RT PCR NEGATIVE NEGATIVE Final    Comment: (NOTE) SARS-CoV-2 target nucleic acids are NOT DETECTED.  The SARS-CoV-2 RNA is generally detectable in upper respiratory specimens during the acute phase of infection. The lowest concentration of SARS-CoV-2 viral copies this assay can detect is 138 copies/mL. A negative result does not preclude SARS-Cov-2 infection and should not be used as the sole basis for treatment or other patient management decisions. A negative result may occur with  improper specimen collection/handling, submission of specimen other than nasopharyngeal swab, presence of viral mutation(s) within the areas targeted by this assay, and inadequate number of viral copies(<138 copies/mL). A negative result must be combined with clinical observations, patient history, and epidemiological information. The expected result is Negative.  Fact Sheet  for Patients:  BloggerCourse.com  Fact Sheet for Healthcare Providers:  SeriousBroker.it  This test is no t yet approved or cleared by the [place] FDA and  has been authorized for detection and/or diagnosis  of SARS-CoV-2 by FDA under an Emergency Use Authorization (EUA). This EUA will remain  in effect (meaning this test can be used) for the duration of the COVID-19 declaration under Section 564(b)(1) of the Act, 21 U.S.C.section 360bbb-3(b)(1), unless the authorization is terminated  or revoked sooner.       Influenza A by PCR NEGATIVE NEGATIVE Final   Influenza B by PCR NEGATIVE NEGATIVE Final    Comment: (NOTE) The Xpert Xpress SARS-CoV-2/FLU/RSV plus assay is intended as an aid in the diagnosis of influenza from Nasopharyngeal swab specimens and should not be used as a sole basis for treatment. Nasal washings and aspirates are unacceptable for Xpert Xpress SARS-CoV-2/FLU/RSV testing.  Fact Sheet for Patients: BloggerCourse.com  Fact Sheet for Healthcare Providers: SeriousBroker.it  This test is not yet approved or cleared by the [place] FDA and has been authorized for detection and/or diagnosis of SARS-CoV-2 by FDA under an Emergency Use Authorization (EUA). This EUA will remain in effect (meaning this test can be used) for the duration of the COVID-19 declaration under Section 564(b)(1) of the Act, 21 U.S.C. section 360bbb-3(b)(1), unless the authorization is terminated or revoked.  Performed at [facility], [address] [place], [place] [address]      Time coordinating discharge:  SIGNED:   Erick Blinks, MD  [facility] 05/19/2021, 2:09 PM   If 7PM-7AM, please contact night-coverage [email or web address]

## 2021-05-19 NOTE — Evaluation (Signed)
Occupational Therapy Evaluation Patient Details Name: Debra Pratt MRN: [id number] DOB: May 31, 1925 Today's Date: 05/19/2021    History of Present Illness Debra Pratt is a 87 y.o. female with medical history significant of dementia, asthma, frequent falls.  Patient's daughter reports that she is a resident of ALF and was previously ambulating with a walker/cane, but due to recurrent falls and safety concerns, she has been mostly in a wheelchair.  Her daughter thinks that patient try to get up on her own today without any assistance and her legs gave out causing her to fall.  Patient does admit to hitting her head on her bed rail.  She denies any loss of consciousness.  Otherwise history is limited since patient is so hard of hearing and may have some confusion.   Clinical Impression   Pt supine in bed upon PT and OT arrival to session. Pt presents with history of cognitive impairments. Pt noted to have slurred speech and was [facility]. Pt had difficulty following commands for UE assessment and was resistant to bed mobility seeking to remain supine in bed with covers over B LE. Pt required max A for supine to sit and sit to supine mobility with noted posterior lean. Pt will benefit from continued OT in the hospital and recommended venue below to increase strength, balance, and endurance for safe ADL's.       Follow Up Recommendations  SNF    Equipment Recommendations  None recommended by OT           Precautions / Restrictions Precautions Precautions: Fall Restrictions Weight Bearing Restrictions: No      Mobility Bed Mobility Overal bed mobility: Needs Assistance Bed Mobility: Supine to Sit;Sit to Supine     Supine to sit: Max assist Sit to supine: Max assist   General bed mobility comments: Pt appeared to be resisting mobility with posterior leaning and possibly pushing.                          Balance Overall balance assessment: Needs assistance Sitting-balance  support: No upper extremity supported;Feet supported Sitting balance-Leahy Scale: Poor Sitting balance - Comments: Pt appeared to be actively pushing posteriorly when prompted to sit at EOB. Postural control: Posterior lean                                 ADL either performed or assessed with clinical judgement   ADL Overall ADL's : Needs assistance/impaired                                       General ADL Comments: Pt cognitive impairments indicate pt needs at least SPV level of assistance for ADLs. Due to difficulty with bed mobility pt likely requires more assist than SPV for ADL's requiring mobility.                         Pertinent Vitals/Pain Pain Assessment: Faces Faces Pain Scale: No hurt     Hand Dominance     Extremity/Trunk Assessment Upper Extremity Assessment Upper Extremity Assessment: Difficult to assess due to impaired cognition (Pt observed to pull on blanket demonstrate WFL grip strength. Pt did not follow commands to assess A/ROM of B UE.)   Lower Extremity Assessment Lower Extremity Assessment: Defer to PT evaluation  Communication Communication Communication: HOH;Expressive difficulties   Cognition Arousal/Alertness: Awake/alert Behavior During Therapy: Agitated Overall Cognitive Status: History of cognitive impairments - at baseline                                 General Comments: Pt moderately resistant to bed mobility.                    Home Living Family/patient expects to be discharged to:: Assisted living                                 Additional Comments: Pt is poor historian and was unbale to provide history. Per document review pt is coming from and ALF.      Prior Functioning/Environment Level of Independence: Needs assistance  Gait / Transfers Assistance Needed: Pt coming from ALF ADL's / Homemaking Assistance Needed: Pt coming from ALF and likely  assisted for ADL's   Comments: Pt is poor historian and was unbale to provide history. Per document review pt is coming from and ALF.        OT Problem List: Decreased strength;Impaired balance (sitting and/or standing);Decreased safety awareness;Decreased knowledge of use of DME or AE      OT Treatment/Interventions: Self-care/ADL training;Therapeutic exercise;Therapeutic activities;Patient/family education;Balance training    OT Goals(Current goals can be found in the care plan section) Acute Rehab OT Goals Patient Stated Goal: none stated OT Goal Formulation: Patient unable to participate in goal setting Time For Goal Achievement: 06/02/21 Potential to Achieve Goals: Fair  OT Frequency: Min 2X/week               Co-evaluation PT/OT/SLP Co-Evaluation/Treatment: Yes Reason for Co-Treatment: Complexity of the patient's impairments (multi-system involvement)   OT goals addressed during session: ADL's and self-care      AM-PAC OT "6 Clicks" Daily Activity     Outcome Measure Help from another person eating meals?: A Lot Help from another person taking care of personal grooming?: A Lot Help from another person toileting, which includes using toliet, bedpan, or urinal?: A Lot Help from another person bathing (including washing, rinsing, drying)?: A Lot Help from another person to put on and taking off regular upper body clothing?: A Lot Help from another person to put on and taking off regular lower body clothing?: A Lot 6 Click Score: 12   End of Session Equipment Utilized During Treatment: Engineer, water Communication: Other (comment) (Nursing in room observing attempts for bed mobility and transfer.)  Activity Tolerance: Other (comment) (Treatment limited by pt cognitive deficits.) Patient left: in bed;with call bell/phone within reach;with bed alarm set  OT Visit Diagnosis: Unsteadiness on feet (R26.81);History of falling (Z91.81);Muscle weakness (generalized)  (M62.81)                Time: 5643-3295 OT Time Calculation (min): 17 min Charges:  OT General Charges $OT Visit: 1 Visit OT Evaluation $OT Eval Moderate Complexity: 1 Mod  Debra Pratt OT, MOT  Debra Pratt 05/19/2021, 9:37 AM

## 2021-05-19 NOTE — Plan of Care (Signed)
  Problem: Acute Rehab PT Goals(only PT should resolve) Goal: Pt Will Go Supine/Side To Sit Outcome: Progressing Flowsheets (Taken 05/19/2021 1156) Pt will go Supine/Side to Sit:  with supervision  with min guard assist Goal: Patient Will Transfer Sit To/From Stand Outcome: Progressing Flowsheets (Taken 05/19/2021 1156) Patient will transfer sit to/from stand:  with minimal assist  with moderate assist Goal: Pt Will Transfer Bed To Chair/Chair To Bed Outcome: Progressing Flowsheets (Taken 05/19/2021 1156) Pt will Transfer Bed to Chair/Chair to Bed:  with min assist  with mod assist Goal: Pt Will Ambulate Outcome: Progressing Flowsheets (Taken 05/19/2021 1156) Pt will Ambulate:  15 feet  with rolling walker  with moderate assist   11:57 AM, 05/19/21 Ocie Bob, MPT Physical Therapist with [facility] [phone] office [phone] mobile phone

## 2021-05-19 NOTE — Evaluation (Signed)
Physical Therapy Evaluation Patient Details Name: Debra Pratt MRN: [id number] DOB: 1925-04-13 Today's Date: 05/19/2021   History of Present Illness  Debra Pratt is a 87 y.o. female with medical history significant of dementia, asthma, frequent falls.  Patient's daughter reports that she is a resident of ALF and was previously ambulating with a walker/cane, but due to recurrent falls and safety concerns, she has been mostly in a wheelchair.  Her daughter thinks that patient try to get up on her own today without any assistance and her legs gave out causing her to fall.  Patient does admit to hitting her head on her bed rail.  She denies any loss of consciousness.  Otherwise history is limited since patient is so hard of hearing and may have some confusion.   Clinical Impression  Patient demonstrates slow labored movement for sitting up at bedside and limited mostly due to possible confusion with frequent leaning backwards while seated at bedside, unable to attempt sit to stands due to poor safety and patient resisting.  Patient will benefit from continued physical therapy in hospital and recommended venue below to increase strength, balance, endurance for safe ADLs and gait.      Follow Up Recommendations SNF    Equipment Recommendations  None recommended by PT    Recommendations for Other Services       Precautions / Restrictions Precautions Precautions: Fall Restrictions Weight Bearing Restrictions: No      Mobility  Bed Mobility Overal bed mobility: Needs Assistance Bed Mobility: Supine to Sit;Sit to Supine     Supine to sit: Max assist Sit to supine: Max assist   General bed mobility comments: Patient very apprehensive and possibly confused    Transfers                    Ambulation/Gait                Stairs            Wheelchair Mobility    Modified Rankin (Stroke Patients Only)       Balance Overall balance assessment: Needs  assistance Sitting-balance support: Feet supported;No upper extremity supported Sitting balance-Leahy Scale: Poor Sitting balance - Comments: frequent leaning backwards Postural control: Posterior lean                                   Pertinent Vitals/Pain Pain Assessment: Faces Faces Pain Scale: No hurt    Home Living Family/patient expects to be discharged to:: Assisted living               Home Equipment: Walker - 2 wheels;Cane - single point;Wheelchair - manual Additional Comments: Pt is poor historian and was unbale to provide history. Per document review pt is coming from and ALF.    Prior Function Level of Independence: Needs assistance   Gait / Transfers Assistance Needed: uses wheelchair mostly, short distanced household ambulator using RW, but frequent falls when attempting to walk  ADL's / Homemaking Assistance Needed: assisted by ALF staff  Comments: Pt is poor historian and was unbale to provide history. Per document review pt is coming from and ALF.     Hand Dominance        Extremity/Trunk Assessment   Upper Extremity Assessment Upper Extremity Assessment: Defer to OT evaluation    Lower Extremity Assessment Lower Extremity Assessment: Generalized weakness    Cervical / Trunk Assessment Cervical /  Trunk Assessment: Normal  Communication   Communication: HOH;Expressive difficulties  Cognition Arousal/Alertness: Awake/alert Behavior During Therapy: Anxious;Agitated Overall Cognitive Status: History of cognitive impairments - at baseline                                 General Comments: Pt moderately resistant to bed mobility.      General Comments      Exercises     Assessment/Plan    PT Assessment Patient needs continued PT services  PT Problem List Decreased strength;Decreased activity tolerance;Decreased balance;Decreased mobility       PT Treatment Interventions DME instruction;Gait  training;Functional mobility training;Therapeutic activities;Patient/family education;Balance training;Stair training    PT Goals (Current goals can be found in the Care Plan section)  Acute Rehab PT Goals Patient Stated Goal: none stated PT Goal Formulation: With patient Time For Goal Achievement: 06/02/21 Potential to Achieve Goals: Good    Frequency Min 3X/week   Barriers to discharge        Co-evaluation PT/OT/SLP Co-Evaluation/Treatment: Yes Reason for Co-Treatment: Complexity of the patient's impairments (multi-system involvement);For patient/therapist safety;To address functional/ADL transfers PT goals addressed during session: Mobility/safety with mobility;Balance OT goals addressed during session: ADL's and self-care       AM-PAC PT "6 Clicks" Mobility  Outcome Measure Help needed turning from your back to your side while in a flat bed without using bedrails?: A Lot Help needed moving from lying on your back to sitting on the side of a flat bed without using bedrails?: A Lot Help needed moving to and from a bed to a chair (including a wheelchair)?: Total Help needed standing up from a chair using your arms (e.g., wheelchair or bedside chair)?: Total Help needed to walk in hospital room?: Total Help needed climbing 3-5 steps with a railing? : Total 6 Click Score: 8    End of Session   Activity Tolerance: Patient limited by fatigue;Treatment limited secondary to agitation Patient left: in bed;with call bell/phone within reach;with chair alarm set Nurse Communication: Mobility status PT Visit Diagnosis: Unsteadiness on feet (R26.81);Other abnormalities of gait and mobility (R26.89);Muscle weakness (generalized) (M62.81)    Time: 4128-7867 PT Time Calculation (min) (ACUTE ONLY): 20 min   Charges:   PT Evaluation $PT Eval Moderate Complexity: 1 Mod PT Treatments $Therapeutic Activity: 8-22 mins        11:54 AM, 05/19/21 Ocie Bob, MPT Physical Therapist  with [facility] 336 [phone] office [phone] mobile phone

## 2021-05-19 NOTE — Progress Notes (Signed)
*  PRELIMINARY RESULTS* Echocardiogram 2D Echocardiogram has been performed.  Carolyne Fiscal 05/19/2021, 10:48 AM

## 2021-05-19 NOTE — Plan of Care (Signed)
  Problem: Acute Rehab OT Goals (only OT should resolve) Goal: Pt. Will Perform Eating Flowsheets (Taken 05/19/2021 0940) Pt Will Perform Eating:  with supervision  bed level Goal: Pt. Will Perform Grooming Flowsheets (Taken 05/19/2021 0940) Pt Will Perform Grooming:  with set-up  with supervision  sitting  bed level  with adaptive equipment Goal: Pt. Will Perform Upper Body Dressing Flowsheets (Taken 05/19/2021 0940) Pt Will Perform Upper Body Dressing:  with supervision  sitting  bed level Goal: Pt. Will Perform Lower Body Dressing Flowsheets (Taken 05/19/2021 0940) Pt Will Perform Lower Body Dressing:  with mod assist  with adaptive equipment  sitting/lateral leans  sit to/from stand Goal: Pt. Will Transfer To Toilet Flowsheets (Taken 05/19/2021 0940) Pt Will Transfer to Toilet:  with min assist  stand pivot transfer  bedside commode Goal: Pt/Caregiver Will Perform Home Exercise Program Flowsheets (Taken 05/19/2021 0940) Pt/caregiver will Perform Home Exercise Program:  Increased strength  Both right and left upper extremity  With Supervision  With minimal assist  Kaida Games OT, MOT

## 2021-06-20 ENCOUNTER — Encounter (HOSPITAL_COMMUNITY): Payer: Self-pay | Admitting: Radiology

## 2021-07-17 ENCOUNTER — Encounter (HOSPITAL_COMMUNITY): Payer: Self-pay | Admitting: Emergency Medicine

## 2021-07-17 ENCOUNTER — Other Ambulatory Visit: Payer: Self-pay

## 2021-07-17 ENCOUNTER — Inpatient Hospital Stay (HOSPITAL_COMMUNITY)
Admission: EM | Admit: 2021-07-17 | Discharge: 2021-07-22 | DRG: 871 | Disposition: A | Discharge status: Skilled Nursing Facility | Payer: Medicare Other | Source: Skilled Nursing Facility | Attending: Internal Medicine | Admitting: Internal Medicine

## 2021-07-17 ENCOUNTER — Emergency Department (HOSPITAL_COMMUNITY): Payer: Medicare Other

## 2021-07-17 DIAGNOSIS — Z20822 Contact with and (suspected) exposure to covid-19: Secondary | ICD-10-CM | POA: Diagnosis present

## 2021-07-17 DIAGNOSIS — E669 Obesity, unspecified: Secondary | ICD-10-CM | POA: Diagnosis present

## 2021-07-17 DIAGNOSIS — R296 Repeated falls: Secondary | ICD-10-CM | POA: Diagnosis present

## 2021-07-17 DIAGNOSIS — N390 Urinary tract infection, site not specified: Secondary | ICD-10-CM | POA: Diagnosis present

## 2021-07-17 DIAGNOSIS — Z87891 Personal history of nicotine dependence: Secondary | ICD-10-CM

## 2021-07-17 DIAGNOSIS — I4891 Unspecified atrial fibrillation: Secondary | ICD-10-CM

## 2021-07-17 DIAGNOSIS — Z6831 Body mass index (BMI) 31.0-31.9, adult: Secondary | ICD-10-CM

## 2021-07-17 DIAGNOSIS — I10 Essential (primary) hypertension: Secondary | ICD-10-CM | POA: Diagnosis present

## 2021-07-17 DIAGNOSIS — I5032 Chronic diastolic (congestive) heart failure: Secondary | ICD-10-CM | POA: Diagnosis present

## 2021-07-17 DIAGNOSIS — Z66 Do not resuscitate: Secondary | ICD-10-CM | POA: Diagnosis present

## 2021-07-17 DIAGNOSIS — Z7902 Long term (current) use of antithrombotics/antiplatelets: Secondary | ICD-10-CM | POA: Diagnosis not present

## 2021-07-17 DIAGNOSIS — Z7951 Long term (current) use of inhaled steroids: Secondary | ICD-10-CM

## 2021-07-17 DIAGNOSIS — E785 Hyperlipidemia, unspecified: Secondary | ICD-10-CM | POA: Diagnosis present

## 2021-07-17 DIAGNOSIS — D649 Anemia, unspecified: Secondary | ICD-10-CM | POA: Diagnosis present

## 2021-07-17 DIAGNOSIS — N1831 Chronic kidney disease, stage 3a: Secondary | ICD-10-CM | POA: Diagnosis present

## 2021-07-17 DIAGNOSIS — J9601 Acute respiratory failure with hypoxia: Secondary | ICD-10-CM | POA: Diagnosis present

## 2021-07-17 DIAGNOSIS — E440 Moderate protein-calorie malnutrition: Secondary | ICD-10-CM | POA: Diagnosis present

## 2021-07-17 DIAGNOSIS — H9190 Unspecified hearing loss, unspecified ear: Secondary | ICD-10-CM | POA: Diagnosis present

## 2021-07-17 DIAGNOSIS — Z8673 Personal history of transient ischemic attack (TIA), and cerebral infarction without residual deficits: Secondary | ICD-10-CM

## 2021-07-17 DIAGNOSIS — F0390 Unspecified dementia without behavioral disturbance: Secondary | ICD-10-CM | POA: Diagnosis present

## 2021-07-17 DIAGNOSIS — Z79899 Other long term (current) drug therapy: Secondary | ICD-10-CM

## 2021-07-17 DIAGNOSIS — Z7982 Long term (current) use of aspirin: Secondary | ICD-10-CM

## 2021-07-17 DIAGNOSIS — I130 Hypertensive heart and chronic kidney disease with heart failure and stage 1 through stage 4 chronic kidney disease, or unspecified chronic kidney disease: Secondary | ICD-10-CM | POA: Diagnosis present

## 2021-07-17 DIAGNOSIS — A419 Sepsis, unspecified organism: Secondary | ICD-10-CM | POA: Diagnosis present

## 2021-07-17 DIAGNOSIS — R6520 Severe sepsis without septic shock: Secondary | ICD-10-CM | POA: Diagnosis not present

## 2021-07-17 DIAGNOSIS — I4892 Unspecified atrial flutter: Secondary | ICD-10-CM

## 2021-07-17 LAB — CBC WITH DIFFERENTIAL/PLATELET
Abs Immature Granulocytes: 0.1 10*3/uL — ABNORMAL HIGH (ref 0.00–0.07)
Basophils Absolute: 0.1 10*3/uL (ref 0.0–0.1)
Basophils Relative: 0 %
Eosinophils Absolute: 0.1 10*3/uL (ref 0.0–0.5)
Eosinophils Relative: 1 %
HCT: 28.4 % — ABNORMAL LOW (ref 36.0–46.0)
Hemoglobin: 8.8 g/dL — ABNORMAL LOW (ref 12.0–15.0)
Immature Granulocytes: 1 %
Lymphocytes Relative: 12 %
Lymphs Abs: 1.8 10*3/uL (ref 0.7–4.0)
MCH: 28.9 pg (ref 26.0–34.0)
MCHC: 31 g/dL (ref 30.0–36.0)
MCV: 93.1 fL (ref 80.0–100.0)
Monocytes Absolute: 1.2 10*3/uL — ABNORMAL HIGH (ref 0.1–1.0)
Monocytes Relative: 7 %
Neutro Abs: 12.6 10*3/uL — ABNORMAL HIGH (ref 1.7–7.7)
Neutrophils Relative %: 79 %
Platelets: 301 10*3/uL (ref 150–400)
RBC: 3.05 MIL/uL — ABNORMAL LOW (ref 3.87–5.11)
RDW: 14 % (ref 11.5–15.5)
WBC: 15.9 10*3/uL — ABNORMAL HIGH (ref 4.0–10.5)
nRBC: 0 % (ref 0.0–0.2)

## 2021-07-17 LAB — PROTIME-INR
INR: 1.1 (ref 0.8–1.2)
Prothrombin Time: 14.4 seconds (ref 11.4–15.2)

## 2021-07-17 LAB — URINALYSIS, ROUTINE W REFLEX MICROSCOPIC
Bacteria, UA: NONE SEEN
Bilirubin Urine: NEGATIVE
Glucose, UA: NEGATIVE mg/dL
Hgb urine dipstick: NEGATIVE
Ketones, ur: 5 mg/dL — AB
Nitrite: NEGATIVE
Protein, ur: 100 mg/dL — AB
Specific Gravity, Urine: 1.02 (ref 1.005–1.030)
WBC, UA: >50 WBC/hpf — ABNORMAL HIGH (ref 0–5)
pH: 7 (ref 5.0–8.0)

## 2021-07-17 LAB — COMPREHENSIVE METABOLIC PANEL
ALT: 17 U/L (ref 0–44)
AST: 21 U/L (ref 15–41)
Albumin: 2.9 g/dL — ABNORMAL LOW (ref 3.5–5.0)
Alkaline Phosphatase: 64 U/L (ref 38–126)
Anion gap: 9 (ref 5–15)
BUN: 34 mg/dL — ABNORMAL HIGH (ref 8–23)
CO2: 22 mmol/L (ref 22–32)
Calcium: 8.3 mg/dL — ABNORMAL LOW (ref 8.9–10.3)
Chloride: 106 mmol/L (ref 98–111)
Creatinine, Ser: 1.11 mg/dL — ABNORMAL HIGH (ref 0.44–1.00)
GFR, Estimated: 46 mL/min — ABNORMAL LOW (ref >60)
Glucose, Bld: 121 mg/dL — ABNORMAL HIGH (ref 70–99)
Potassium: 4 mmol/L (ref 3.5–5.1)
Sodium: 137 mmol/L (ref 135–145)
Total Bilirubin: 0.5 mg/dL (ref 0.3–1.2)
Total Protein: 7.3 g/dL (ref 6.5–8.1)

## 2021-07-17 LAB — RESP PANEL BY RT-PCR (FLU A&B, COVID) ARPGX2
Influenza A by PCR: NEGATIVE
Influenza B by PCR: NEGATIVE
SARS Coronavirus 2 by RT PCR: NEGATIVE

## 2021-07-17 LAB — LACTIC ACID, PLASMA: Lactic Acid, Venous: 1 mmol/L (ref 0.5–1.9)

## 2021-07-17 IMAGING — DX DG CHEST 1V PORT
1 series · 1 of 1 positions shown · non-contrast
Comparison: [DATE]

CLINICAL DATA: Sepsis

EXAM:
PORTABLE CHEST 1 VIEW

[chest ap grid]
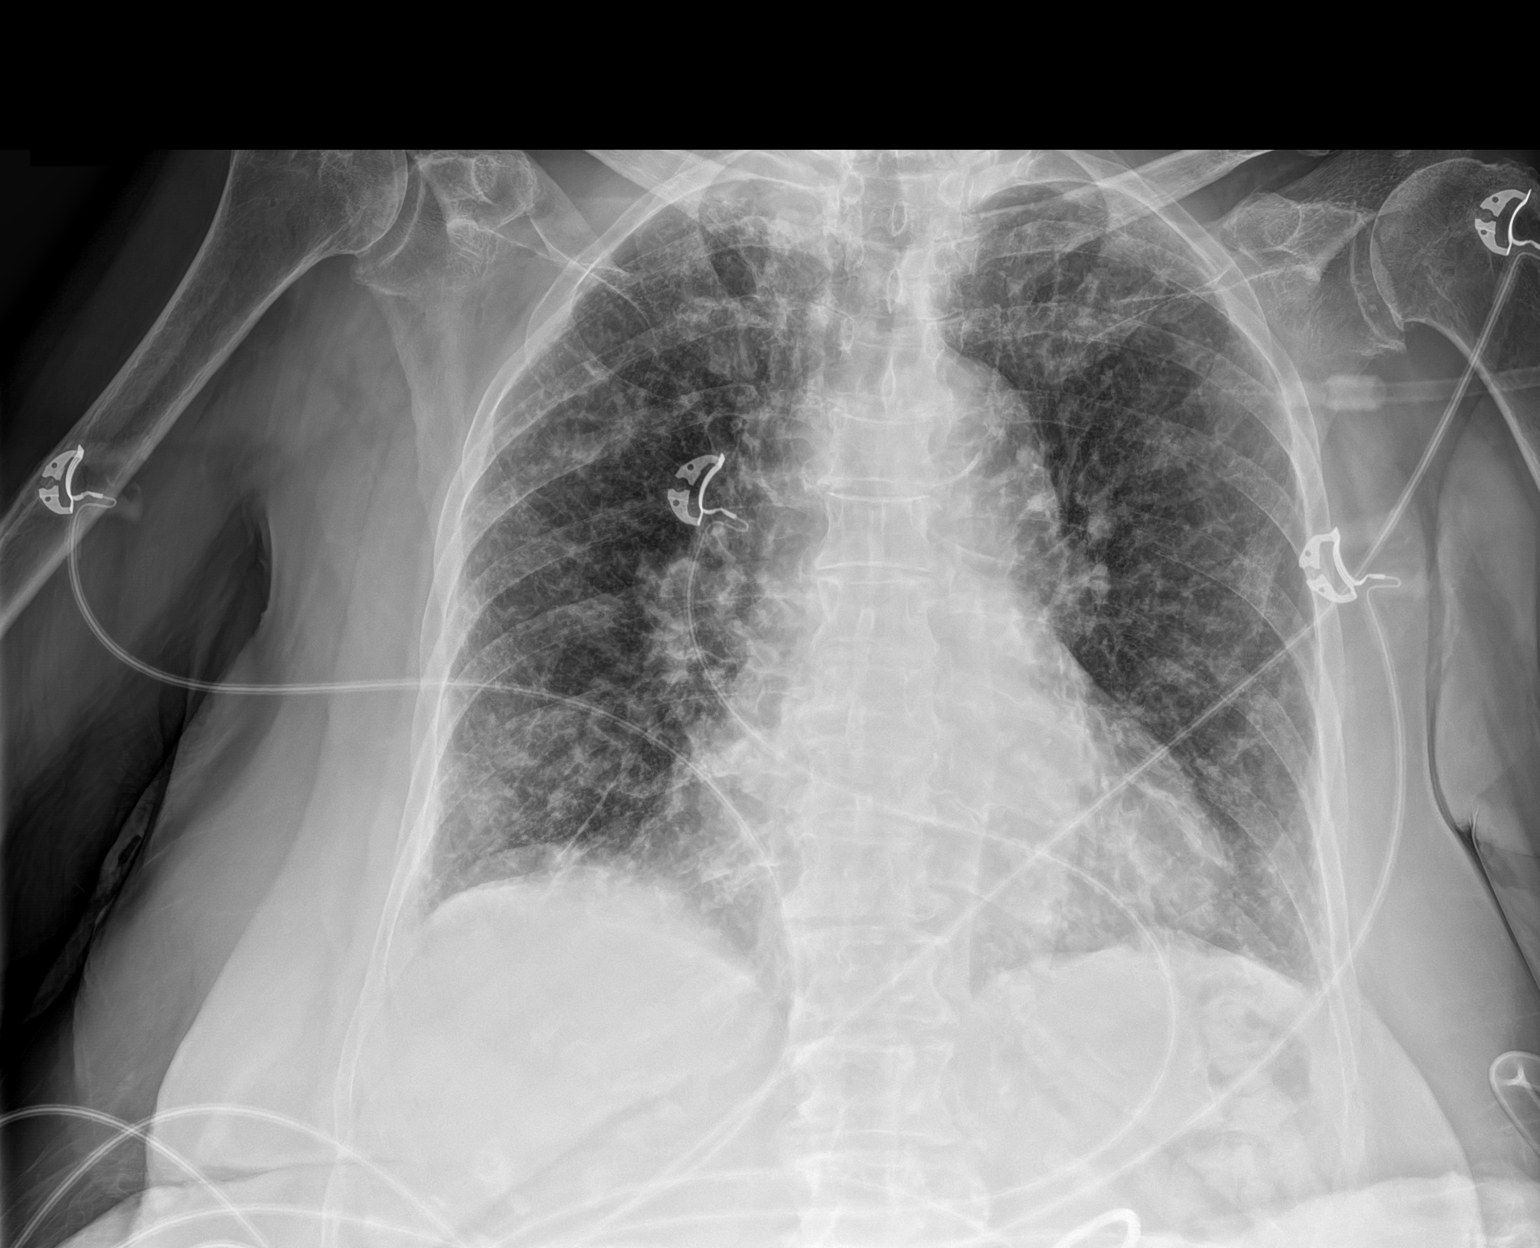

[1 of 1 positions shown; findings below may reference images not displayed]

FINDINGS: Diffuse mild interstitial opacity. Cardiomediastinal contours are
normal. No pleural effusion or pneumothorax. No focal airspace
consolidation.
IMPRESSION: Diffuse mild interstitial opacity without focal airspace disease.
This could indicate viral infection.

## 2021-07-17 MED ORDER — SODIUM CHLORIDE 0.9 % IV BOLUS
1000.0000 mL | Freq: Once | INTRAVENOUS | Status: AC
  Administered 2021-07-17: 1000 mL via INTRAVENOUS

## 2021-07-17 MED ORDER — VANCOMYCIN HCL 1750 MG/350ML IV SOLN
1750.0000 mg | Freq: Once | INTRAVENOUS | Status: AC
  Administered 2021-07-17: 1750 mg via INTRAVENOUS
  Filled 2021-07-17: qty 350

## 2021-07-17 MED ORDER — VANCOMYCIN HCL 1250 MG/250ML IV SOLN: 1250.0000 mg | INTRAVENOUS | Status: DC

## 2021-07-17 MED ORDER — SODIUM CHLORIDE 0.9 % IV SOLN
2.0000 g | Freq: Two times a day (BID) | INTRAVENOUS | Status: DC
  Administered 2021-07-17 – 2021-07-20 (×7): 2 g via INTRAVENOUS
  Filled 2021-07-17 (×7): qty 2

## 2021-07-17 MED ORDER — ACETAMINOPHEN 325 MG PO TABS
650.0000 mg | ORAL_TABLET | Freq: Once | ORAL | Status: AC
  Administered 2021-07-17: 650 mg via ORAL
  Filled 2021-07-17: qty 2

## 2021-07-17 NOTE — ED Provider Notes (Addendum)
[facility] EMERGENCY DEPARTMENT Provider Note   CSN: [id number] Arrival date & time: 07/17/21  2019  LEVEL 5 CAVEAT - DEMENTIA   History Chief Complaint  Patient presents with   Fever    Debra Pratt is a 87 y.o. female.  HPI 87 year old female presents with fever. History is limited from the patient due to her dementia. I discussed with [facility] staff, and they state she had a fever starting today. It was 100. Vomited tonight. Some cough and wheezing. Has been feeling weak for a couple days, has lost her appetite.  Told the aide she wasn't feeling good.  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 No pertinent surgical history.   OB History   No obstetric history on file.     History reviewed. No pertinent family history.  Social History   Tobacco Use   Smoking status: Former    Years: 20.00    Types: Cigarettes   Smokeless tobacco: Never  Substance Use Topics   Alcohol use: Not Currently   Drug use: Not Currently    Home Medications Prior to Admission medications   Medication Sig Start Date End Date Taking? Authorizing Provider  acetaminophen (TYLENOL) 500 MG tablet Take 500 mg by mouth every 6 (six) hours as needed for mild pain, fever or headache.    [provider]  albuterol (VENTOLIN HFA) 108 (90 Base) MCG/ACT inhaler Inhale 1-2 puffs into the lungs every 6 (six) hours as needed for wheezing or shortness of breath. 05/12/21   [provider]  amLODipine (NORVASC) 2.5 MG tablet Take 1 tablet (2.5 mg total) by mouth daily. 05/19/21 05/19/22  Erick Blinks, MD  aspirin EC 81 MG tablet Take 1 tablet  (81 mg total) by mouth daily. Swallow whole. 05/19/21 05/19/22  Erick Blinks, MD  atorvastatin (LIPITOR) 40 MG tablet Take 1 tablet (40 mg total) by mouth daily. 05/19/21 05/19/22  Erick Blinks, MD  Calcium Carbonate (CALCIUM 500 PO) Take 1 tablet by mouth daily.    [provider]  Cholecalciferol (D3 5000) 125 MCG (5000 UT) capsule Take 5,000 Units by mouth daily.    [provider]  clopidogrel (PLAVIX) 75 MG tablet Take 1 tablet (75 mg total) by mouth daily with breakfast. 05/20/21   Erick Blinks, MD  SYMBICORT 80-4.5 MCG/ACT inhaler Inhale 2 puffs into the lungs 2 (two) times daily. 04/28/21   [provider]  vitamin B-12 (CYANOCOBALAMIN) 500 MCG tablet Take 500 mcg by mouth daily. 11/27/20   [provider]    Allergies    Patient has no known allergies.  Review of Systems   Review of Systems  Unable to perform ROS: Dementia   Physical Exam Updated Vital Signs BP 121/76   Pulse (!) 116   Temp 98.6 F (37 C) (Rectal)   Resp 18   Ht 5\' 7"  (1.702 m)   Wt 90.7 kg   SpO2 94%   BMI 31.32 kg/m   Physical Exam Vitals and nursing note reviewed.  Constitutional:      Appearance: She is well-developed. She is not diaphoretic.  HENT:  Head: Normocephalic and atraumatic.     Right Ear: External ear normal.     Left Ear: External ear normal.     Nose: Nose normal.  Eyes:     General:        Right eye: No discharge.        Left eye: No discharge.  Cardiovascular:     Rate and Rhythm: Regular rhythm. Tachycardia present.     Heart sounds: Normal heart sounds.  Pulmonary:     Effort: Pulmonary effort is normal. Tachypnea present. No accessory muscle usage.     Breath sounds: Normal breath sounds. No wheezing.  Abdominal:     General: There is no distension.     Palpations: Abdomen is soft.     Tenderness: There is no abdominal tenderness.  Skin:    General: Skin is warm and dry.  Neurological:     Mental Status: She is alert. She  is disoriented.  Psychiatric:        Mood and Affect: Mood is not anxious.    ED Results / Procedures / Treatments   Labs (all labs ordered are listed, but only abnormal results are displayed) Labs Reviewed  COMPREHENSIVE METABOLIC PANEL - Abnormal; Notable for the following components:      Result Value   Glucose, Bld 121 (*)    BUN 34 (*)    Creatinine, Ser 1.11 (*)    Calcium 8.3 (*)    Albumin 2.9 (*)    GFR, Estimated 46 (*)    All other components within normal limits  CBC WITH DIFFERENTIAL/PLATELET - Abnormal; Notable for the following components:   WBC 15.9 (*)    RBC 3.05 (*)    Hemoglobin 8.8 (*)    HCT 28.4 (*)    Neutro Abs 12.6 (*)    Monocytes Absolute 1.2 (*)    Abs Immature Granulocytes 0.10 (*)    All other components within normal limits  URINALYSIS, ROUTINE W REFLEX MICROSCOPIC - Abnormal; Notable for the following components:   APPearance TURBID (*)    Ketones, ur 5 (*)    Protein, ur 100 (*)    Leukocytes,Ua LARGE (*)    WBC, UA >50 (*)    All other components within normal limits  CULTURE, BLOOD (ROUTINE X 2)  CULTURE, BLOOD (ROUTINE X 2)  RESP PANEL BY RT-PCR (FLU A&B, COVID) [id number]  URINE CULTURE  LACTIC ACID, PLASMA  PROTIME-INR  LACTIC ACID, PLASMA    EKG None  Radiology DG Chest Portable 1 View  Result Date: 07/17/2021 CLINICAL DATA:  Sepsis EXAM: PORTABLE CHEST 1 VIEW COMPARISON:  05/18/2021 FINDINGS: Diffuse mild interstitial opacity. Cardiomediastinal contours are normal. No pleural effusion or pneumothorax. No focal airspace consolidation. IMPRESSION: Diffuse mild interstitial opacity without focal airspace disease. This could indicate viral infection. Electronically Signed   By: Deatra Robinson M.D.   On: 07/17/2021 21:25    Procedures Procedures   Medications Ordered in ED Medications  ceFEPIme (MAXIPIME) 2 g in sodium chloride 0.9 % 100 mL IVPB (0 g Intravenous Stopped 07/17/21 2301)  vancomycin (VANCOREADY) IVPB 1750 mg/350 mL  (1,750 mg Intravenous New Bag/Given 07/17/21 2144)    Followed by  vancomycin (VANCOREADY) IVPB 1250 mg/250 mL (has no administration in time range)  acetaminophen (TYLENOL) tablet 650 mg (650 mg Oral Given 07/17/21 2134)  sodium chloride 0.9 % bolus 1,000 mL (1,000 mLs Intravenous New Bag/Given 07/17/21 2136)    ED Course  I have reviewed the triage vital signs  and the nursing notes.  Pertinent labs & imaging results that were available during my care of the patient were reviewed by me and considered in my medical decision making (see chart for details).    MDM Rules/Calculators/A&P                           Patient presents with fever and sepsis.  Lactate is okay but she does have a fever and leukocytosis.   With the vomiting from earlier but with the cough and some tachypnea this could be pneumonia, especially given that she is requiring 2 L of oxygen which is new according to the facility.  Given broad IV antibiotics as this could be hospital-acquired since she is in a facility.  Give a bolus of fluid and Tylenol.  Admit to hospitalist service. Final Clinical Impression(s) / ED Diagnoses Final diagnoses:  Sepsis without acute organ dysfunction, due to unspecified organism [facility])    Rx / DC Orders ED Discharge Orders     None        Pricilla Loveless, MD 07/17/21 9417    Pricilla Loveless, MD 07/17/21 2349

## 2021-07-17 NOTE — ED Triage Notes (Signed)
Pt brought in from [place] via [facility] after they were called for AMS. Pt found to have oral temp of 104.

## 2021-07-17 NOTE — Progress Notes (Signed)
Pharmacy Antibiotic Note  Debra Pratt is a 87 y.o. female admitted on 07/17/2021 with pneumonia.  Pharmacy has been consulted for Vancomycin and cefepime dosing.  Plan: Vancomycin 1750mg  IV loading dose, then 1250mg  IV q24h Cefepime 2gm IV q12h F/U cxs and clinical progress Monitor V/S, labs and levels as indicate  Height: 5\' 7"  (170.2 cm) Weight: 90.7 kg (200 lb) IBW/kg (Calculated) : 61.6 Temp (24hrs), Avg:101.3 F (38.5 C), Min:101.3 F (38.5 C), Max:101.3 F (38.5 C)  Recent Labs  Lab 07/17/21 2055  WBC 15.9*  LATICACIDVEN 1.0    Est crcl 80mls/min CrCl cannot be calculated (Patient's most recent lab result is older than the maximum 21 days allowed.).    No Known Allergies  Antimicrobials this admission: Vancomycin 10/16 >>  Cefe[pime 10/16 >>    Microbiology results: 10/16 BCx: pending 10/16 UCx: pending   MRSA PCR:   Thank you for allowing pharmacy to be a part of this patient's care.  11/16, BS 11/16, 11/16 Clinical Pharmacist Pager [phone]  07/17/2021 9:36 PM

## 2021-07-18 ENCOUNTER — Other Ambulatory Visit: Payer: Self-pay

## 2021-07-18 ENCOUNTER — Encounter (HOSPITAL_COMMUNITY): Payer: Self-pay | Admitting: Family Medicine

## 2021-07-18 DIAGNOSIS — I10 Essential (primary) hypertension: Secondary | ICD-10-CM | POA: Diagnosis present

## 2021-07-18 DIAGNOSIS — A419 Sepsis, unspecified organism: Principal | ICD-10-CM

## 2021-07-18 DIAGNOSIS — R6520 Severe sepsis without septic shock: Secondary | ICD-10-CM

## 2021-07-18 DIAGNOSIS — J9601 Acute respiratory failure with hypoxia: Secondary | ICD-10-CM | POA: Diagnosis present

## 2021-07-18 DIAGNOSIS — E440 Moderate protein-calorie malnutrition: Secondary | ICD-10-CM | POA: Diagnosis present

## 2021-07-18 LAB — CBC WITH DIFFERENTIAL/PLATELET
Abs Immature Granulocytes: 0.1 10*3/uL — ABNORMAL HIGH (ref 0.00–0.07)
Basophils Absolute: 0.1 10*3/uL (ref 0.0–0.1)
Basophils Relative: 1 %
Eosinophils Absolute: 0.1 10*3/uL (ref 0.0–0.5)
Eosinophils Relative: 1 %
HCT: 27.6 % — ABNORMAL LOW (ref 36.0–46.0)
Hemoglobin: 8.2 g/dL — ABNORMAL LOW (ref 12.0–15.0)
Immature Granulocytes: 1 %
Lymphocytes Relative: 22 %
Lymphs Abs: 3.2 10*3/uL (ref 0.7–4.0)
MCH: 27.8 pg (ref 26.0–34.0)
MCHC: 29.7 g/dL — ABNORMAL LOW (ref 30.0–36.0)
MCV: 93.6 fL (ref 80.0–100.0)
Monocytes Absolute: 1.2 10*3/uL — ABNORMAL HIGH (ref 0.1–1.0)
Monocytes Relative: 8 %
Neutro Abs: 9.9 10*3/uL — ABNORMAL HIGH (ref 1.7–7.7)
Neutrophils Relative %: 67 %
Platelets: 295 10*3/uL (ref 150–400)
RBC: 2.95 MIL/uL — ABNORMAL LOW (ref 3.87–5.11)
RDW: 14 % (ref 11.5–15.5)
WBC: 14.6 10*3/uL — ABNORMAL HIGH (ref 4.0–10.5)
nRBC: 0 % (ref 0.0–0.2)

## 2021-07-18 LAB — BLOOD CULTURE ID PANEL (REFLEXED) - BCID2

## 2021-07-18 LAB — RESPIRATORY PANEL BY PCR

## 2021-07-18 LAB — COMPREHENSIVE METABOLIC PANEL
ALT: 17 U/L (ref 0–44)
AST: 18 U/L (ref 15–41)
Albumin: 2.6 g/dL — ABNORMAL LOW (ref 3.5–5.0)
Alkaline Phosphatase: 60 U/L (ref 38–126)
Anion gap: 4 — ABNORMAL LOW (ref 5–15)
BUN: 29 mg/dL — ABNORMAL HIGH (ref 8–23)
CO2: 26 mmol/L (ref 22–32)
Calcium: 8 mg/dL — ABNORMAL LOW (ref 8.9–10.3)
Chloride: 111 mmol/L (ref 98–111)
Creatinine, Ser: 1.03 mg/dL — ABNORMAL HIGH (ref 0.44–1.00)
GFR, Estimated: 50 mL/min — ABNORMAL LOW (ref >60)
Glucose, Bld: 107 mg/dL — ABNORMAL HIGH (ref 70–99)
Potassium: 4 mmol/L (ref 3.5–5.1)
Sodium: 141 mmol/L (ref 135–145)
Total Bilirubin: 0.5 mg/dL (ref 0.3–1.2)
Total Protein: 6.6 g/dL (ref 6.5–8.1)

## 2021-07-18 LAB — MAGNESIUM: Magnesium: 2.2 mg/dL (ref 1.7–2.4)

## 2021-07-18 LAB — CORTISOL-AM, BLOOD: Cortisol - AM: 11 ug/dL (ref 6.7–22.6)

## 2021-07-18 LAB — PROTIME-INR
INR: 1.2 (ref 0.8–1.2)
Prothrombin Time: 14.7 seconds (ref 11.4–15.2)

## 2021-07-18 LAB — MRSA NEXT GEN BY PCR, NASAL: MRSA by PCR Next Gen: NOT DETECTED

## 2021-07-18 LAB — PROCALCITONIN: Procalcitonin: 0.15 ng/mL

## 2021-07-18 LAB — LACTIC ACID, PLASMA: Lactic Acid, Venous: 0.7 mmol/L (ref 0.5–1.9)

## 2021-07-18 MED ORDER — OXYCODONE HCL 5 MG PO TABS: 5.0000 mg | ORAL_TABLET | ORAL | Status: DC | PRN

## 2021-07-18 MED ORDER — AMLODIPINE BESYLATE 5 MG PO TABS: 2.5000 mg | ORAL_TABLET | Freq: Every day | ORAL | Status: DC

## 2021-07-18 MED ORDER — ONDANSETRON HCL 4 MG/2ML IJ SOLN: 4.0000 mg | Freq: Four times a day (QID) | INTRAMUSCULAR | Status: DC | PRN

## 2021-07-18 MED ORDER — SODIUM CHLORIDE 0.9 % IV SOLN: INTRAVENOUS | Status: DC

## 2021-07-18 MED ORDER — FLUTICASONE FUROATE-VILANTEROL 100-25 MCG/INH IN AEPB
1.0000 | INHALATION_SPRAY | Freq: Every day | RESPIRATORY_TRACT | Status: DC
  Administered 2021-07-18 – 2021-07-22 (×5): 1 via RESPIRATORY_TRACT
  Filled 2021-07-18: qty 28

## 2021-07-18 MED ORDER — ASPIRIN EC 81 MG PO TBEC
81.0000 mg | DELAYED_RELEASE_TABLET | Freq: Every day | ORAL | Status: DC
  Administered 2021-07-18 – 2021-07-19 (×2): 81 mg via ORAL
  Filled 2021-07-18 (×2): qty 1

## 2021-07-18 MED ORDER — ACETAMINOPHEN 650 MG RE SUPP: 650.0000 mg | Freq: Four times a day (QID) | RECTAL | Status: DC | PRN

## 2021-07-18 MED ORDER — ONDANSETRON HCL 4 MG PO TABS: 4.0000 mg | ORAL_TABLET | Freq: Four times a day (QID) | ORAL | Status: DC | PRN

## 2021-07-18 MED ORDER — CLOPIDOGREL BISULFATE 75 MG PO TABS
75.0000 mg | ORAL_TABLET | Freq: Every day | ORAL | Status: DC
  Administered 2021-07-18 – 2021-07-21 (×4): 75 mg via ORAL
  Filled 2021-07-18 (×4): qty 1

## 2021-07-18 MED ORDER — VITAMIN B-12 1000 MCG PO TABS
500.0000 ug | ORAL_TABLET | Freq: Every day | ORAL | Status: DC
  Administered 2021-07-18 – 2021-07-22 (×5): 500 ug via ORAL
  Filled 2021-07-18 (×5): qty 1

## 2021-07-18 MED ORDER — DICLOFENAC SODIUM 1 % EX GEL
2.0000 g | Freq: Three times a day (TID) | CUTANEOUS | Status: DC
  Administered 2021-07-18 – 2021-07-20 (×5): 2 g via TOPICAL
  Filled 2021-07-18: qty 100

## 2021-07-18 MED ORDER — HEPARIN SODIUM (PORCINE) 5000 UNIT/ML IJ SOLN
5000.0000 [IU] | Freq: Three times a day (TID) | INTRAMUSCULAR | Status: DC
  Administered 2021-07-18 (×2): 5000 [IU] via SUBCUTANEOUS
  Filled 2021-07-18 (×2): qty 1

## 2021-07-18 MED ORDER — DICLOFENAC SODIUM 1 % EX GEL
2.0000 g | Freq: Three times a day (TID) | CUTANEOUS | Status: DC
  Administered 2021-07-18: 2 g via TOPICAL
  Filled 2021-07-18: qty 100

## 2021-07-18 MED ORDER — ACETAMINOPHEN 325 MG PO TABS
650.0000 mg | ORAL_TABLET | Freq: Four times a day (QID) | ORAL | Status: DC | PRN
  Administered 2021-07-18 – 2021-07-19 (×2): 650 mg via ORAL
  Filled 2021-07-18 (×2): qty 2

## 2021-07-18 MED ORDER — ALBUTEROL SULFATE (2.5 MG/3ML) 0.083% IN NEBU: 2.5000 mg | INHALATION_SOLUTION | Freq: Four times a day (QID) | RESPIRATORY_TRACT | Status: DC | PRN

## 2021-07-18 MED ORDER — ATORVASTATIN CALCIUM 40 MG PO TABS
40.0000 mg | ORAL_TABLET | Freq: Every day | ORAL | Status: DC
  Administered 2021-07-18 – 2021-07-22 (×5): 40 mg via ORAL
  Filled 2021-07-18 (×5): qty 1

## 2021-07-18 MED ORDER — TRAZODONE HCL 50 MG PO TABS
50.0000 mg | ORAL_TABLET | Freq: Every evening | ORAL | Status: DC | PRN
  Administered 2021-07-21: 50 mg via ORAL
  Filled 2021-07-18: qty 1

## 2021-07-18 MED ORDER — ALBUTEROL SULFATE HFA 108 (90 BASE) MCG/ACT IN AERS: 1.0000 | INHALATION_SPRAY | Freq: Four times a day (QID) | RESPIRATORY_TRACT | Status: DC | PRN

## 2021-07-18 NOTE — Progress Notes (Signed)
PROGRESS NOTE  Jason Hauge ZOX:[id number] DOB: January 06, 1925 DOA: 07/17/2021 PCP: Patient, No Pcp Per (Inactive)  HPI/Recap of past 24 hours: Debra Pratt  is a 87 y.o. female, with history of dementia presents the ED with a chief complaint of fever. History is limited due to patient's dementia. Pt resides at SNF facility-[facility], reported that she had a temperature of 100 and had an episode of vomiting, cough, wheezing, loss of appetite and generalized weakness. Of note, pt is very HOH. No further history could be obtained. In the ED, temp 101.3, respiratory rate as high as 32, heart rate as high as 131, blood pressure as low as 97/65, satting at 96% on 2 L nasal cannula Leukocytosis 15.9, hemoglobin 8.8 down from about 12 in 8/22. Flu and covid negative. UA positive for UTI, UC pending. Chest x-ray shows mild interstitial opacity without focal airspace disease that could indicate viral infection. Patient admitted for further management.    Today, pt alert, awake, not oriented. Denies any new complaints (although unreliable). Appeared comfortable eating her breakfast. Asked for her cup of coffee.    Assessment/Plan: Active Problems:   Sepsis (HCC)   Essential hypertension   Acute respiratory failure with hypoxia (HCC)   Moderate protein-calorie malnutrition (HCC)   Sepsis likely 2/2 UTI Vs pneumonia On admission, febrile, respiratory rate 32, heart rate 131, leukocytosis 15.9 Last temp spike was 101.3 on 04/16/2021, with leukocytosis LA WNL, procalcitonin 0.15, will trend UA positive for UTI, UC pending BC x2 pending Chest x-ray shows mild interstitial opacity without focal airspace disease that could indicate viral infection COVID, flu negative, respiratory viral panel pending S/p IV fluids Continue cefepime, d/c vanc Telemetry, monitor closely  Hypertension BP soft Hold home amlodipine in the setting of sepsis as well  Normocytic anemia Drop in hemoglobin from 12 in 8/22  to 8.8 ??  Recently started on Plavix on 8/22 Anemia panel pending, FOBT pending Daily CBC Monitor for any signs of bleeding, hold DVT prophylaxis for now  History of CVA HLD Noted acute/subacute CVA on 8/22 Continue Lipitor, aspirin, Plavix  Obesity Lifestyle modification advised   Estimated body mass index is 31.32 kg/m as calculated from the following:   Height as of this encounter: 5\' 7"  (1.702 m).   Weight as of this encounter: 90.7 kg.     Code Status: DNR  Family Communication: None at bedside  Disposition Plan: Status is: Inpatient  Remains inpatient appropriate because: Level of care, sepsis    Consultants: None  Procedures: None  Antimicrobials: Cefepime  DVT prophylaxis: SCDs for now   Objective: Vitals:   07/18/21 1030 07/18/21 1100 07/18/21 1115 07/18/21 1330  BP: (!) 106/50   (!) 145/92  Pulse: 82 77 78 82  Resp: 17 18 13 18   Temp:      TempSrc:      SpO2: 100% 98% 98% 98%  Weight:      Height:        Intake/Output Summary (Last 24 hours) at 07/18/2021 1403 Last data filed at 07/18/2021 1039 Gross per 24 hour  Intake 1550 ml  Output --  Net 1550 ml   Filed Weights   07/17/21 2026  Weight: 90.7 kg    Exam: General: NAD, HOH, alert, awake, not oriented Cardiovascular: S1, S2 present Respiratory: CTAB Abdomen: Soft, nontender, nondistended, bowel sounds present Musculoskeletal: No bilateral pedal edema noted Skin: Normal Psychiatry: Unable to assess    Data Reviewed: CBC: Recent Labs  Lab 07/17/21 2055 07/18/21 0417  WBC 15.9* 14.6*  NEUTROABS 12.6* 9.9*  HGB 8.8* 8.2*  HCT 28.4* 27.6*  MCV 93.1 93.6  PLT 301 295   Basic Metabolic Panel: Recent Labs  Lab 07/17/21 2055 07/18/21 0417  NA 137 141  K 4.0 4.0  CL 106 111  CO2 22 26  GLUCOSE 121* 107*  BUN 34* 29*  CREATININE 1.11* 1.03*  CALCIUM 8.3* 8.0*  MG  --  2.2   GFR: Estimated Creatinine Clearance: 38.6 mL/min (A) (by C-G formula based on SCr of  1.03 mg/dL (H)). Liver Function Tests: Recent Labs  Lab 07/17/21 2055 07/18/21 0417  AST 21 18  ALT 17 17  ALKPHOS 64 60  BILITOT 0.5 0.5  PROT 7.3 6.6  ALBUMIN 2.9* 2.6*   No results for input(s): LIPASE, AMYLASE in the last 168 hours. No results for input(s): AMMONIA in the last 168 hours. Coagulation Profile: Recent Labs  Lab 07/17/21 2055 07/18/21 0417  INR 1.1 1.2   Cardiac Enzymes: No results for input(s): CKTOTAL, CKMB, CKMBINDEX, TROPONINI in the last 168 hours. BNP (last 3 results) No results for input(s): PROBNP in the last 8760 hours. HbA1C: No results for input(s): HGBA1C in the last 72 hours. CBG: No results for input(s): GLUCAP in the last 168 hours. Lipid Profile: No results for input(s): CHOL, HDL, LDLCALC, TRIG, CHOLHDL, LDLDIRECT in the last 72 hours. Thyroid Function Tests: No results for input(s): TSH, T4TOTAL, FREET4, T3FREE, THYROIDAB in the last 72 hours. Anemia Panel: No results for input(s): VITAMINB12, FOLATE, FERRITIN, TIBC, IRON, RETICCTPCT in the last 72 hours. Urine analysis:    Component Value Date/Time   COLORURINE YELLOW 07/17/2021 2115   APPEARANCEUR TURBID (A) 07/17/2021 2115   LABSPEC 1.020 07/17/2021 2115   PHURINE 7.0 07/17/2021 2115   GLUCOSEU NEGATIVE 07/17/2021 2115   HGBUR NEGATIVE 07/17/2021 2115   BILIRUBINUR NEGATIVE 07/17/2021 2115   KETONESUR 5 (A) 07/17/2021 2115   PROTEINUR 100 (A) 07/17/2021 2115   NITRITE NEGATIVE 07/17/2021 2115   LEUKOCYTESUR LARGE (A) 07/17/2021 2115   Sepsis Labs: @LABRCNTIP (procalcitonin:4,lacticidven:4)  ) Recent Results (from the past 240 hour(s))  Culture, blood (Routine x 2)     Status: None (Preliminary result)   Collection Time: 07/17/21  8:12 PM   Specimen: BLOOD  Result Value Ref Range Status   Specimen Description BLOOD BLOOD LEFT FOREARM  Final   Special Requests   Final    BOTTLES DRAWN AEROBIC AND ANAEROBIC Blood Culture adequate volume   Culture   Final    NO GROWTH <  12 HOURS Performed at [facility], [address], [place], [place]    Report Status PENDING  Incomplete  Resp Panel by RT-PCR (Flu A&B, Covid) Nasopharyngeal Swab     Status: None   Collection Time: 07/17/21  8:48 PM   Specimen: Nasopharyngeal Swab; Nasopharyngeal(NP) swabs in vial transport medium  Result Value Ref Range Status   SARS Coronavirus 2 by RT PCR NEGATIVE NEGATIVE Final    Comment: (NOTE) SARS-CoV-2 target nucleic acids are NOT DETECTED.  The SARS-CoV-2 RNA is generally detectable in upper respiratory specimens during the acute phase of infection. The lowest concentration of SARS-CoV-2 viral copies this assay can detect is 138 copies/mL. A negative result does not preclude SARS-Cov-2 infection and should not be used as the sole basis for treatment or other patient management decisions. A negative result may occur with  improper specimen collection/handling, submission of specimen other than nasopharyngeal swab, presence of viral mutation(s) within the areas  targeted by this assay, and inadequate number of viral copies(<138 copies/mL). A negative result must be combined with clinical observations, patient history, and epidemiological information. The expected result is Negative.  Fact Sheet for Patients:  BloggerCourse.com  Fact Sheet for Healthcare Providers:  SeriousBroker.it  This test is no t yet approved or cleared by the [place] FDA and  has been authorized for detection and/or diagnosis of SARS-CoV-2 by FDA under an Emergency Use Authorization (EUA). This EUA will remain  in effect (meaning this test can be used) for the duration of the COVID-19 declaration under Section 564(b)(1) of the Act, 21 U.S.C.section 360bbb-3(b)(1), unless the authorization is terminated  or revoked sooner.       Influenza A by PCR NEGATIVE NEGATIVE Final   Influenza B by PCR NEGATIVE NEGATIVE Final    Comment:  (NOTE) The Xpert Xpress SARS-CoV-2/FLU/RSV plus assay is intended as an aid in the diagnosis of influenza from Nasopharyngeal swab specimens and should not be used as a sole basis for treatment. Nasal washings and aspirates are unacceptable for Xpert Xpress SARS-CoV-2/FLU/RSV testing.  Fact Sheet for Patients: BloggerCourse.com  Fact Sheet for Healthcare Providers: SeriousBroker.it  This test is not yet approved or cleared by the [place] FDA and has been authorized for detection and/or diagnosis of SARS-CoV-2 by FDA under an Emergency Use Authorization (EUA). This EUA will remain in effect (meaning this test can be used) for the duration of the COVID-19 declaration under Section 564(b)(1) of the Act, 21 U.S.C. section 360bbb-3(b)(1), unless the authorization is terminated or revoked.  Performed at [facility], [address], [place], [place] [address]   Culture, blood (Routine x 2)     Status: None (Preliminary result)   Collection Time: 07/17/21  8:55 PM   Specimen: BLOOD  Result Value Ref Range Status   Specimen Description BLOOD BLOOD LEFT WRIST  Final   Special Requests   Final    BOTTLES DRAWN AEROBIC AND ANAEROBIC Blood Culture adequate volume   Culture   Final    NO GROWTH < 12 HOURS Performed at [facility], [address] [place], [place] [address]    Report Status PENDING  Incomplete  MRSA Next Gen by PCR, Nasal     Status: None   Collection Time: 07/18/21 11:00 AM   Specimen: Nasal Mucosa; Nasal Swab  Result Value Ref Range Status   MRSA by PCR Next Gen NOT DETECTED NOT DETECTED Final    Comment: (NOTE) The GeneXpert MRSA Assay (FDA approved for NASAL specimens only), is one component of a comprehensive MRSA colonization surveillance program. It is not intended to diagnose MRSA infection nor to guide or monitor treatment for MRSA infections. Test performance is not FDA approved in patients less than 36  years old. Performed at [facility], [address], [place], [place] [address]       Studies: DG Chest Portable 1 View  Result Date: 07/17/2021 CLINICAL DATA:  Sepsis EXAM: PORTABLE CHEST 1 VIEW COMPARISON:  05/18/2021 FINDINGS: Diffuse mild interstitial opacity. Cardiomediastinal contours are normal. No pleural effusion or pneumothorax. No focal airspace consolidation. IMPRESSION: Diffuse mild interstitial opacity without focal airspace disease. This could indicate viral infection. Electronically Signed   By: Deatra Robinson M.D.   On: 07/17/2021 21:25    Scheduled Meds:  aspirin EC  81 mg Oral Daily   atorvastatin  40 mg Oral Daily   clopidogrel  75 mg Oral Q breakfast   diclofenac Sodium  2 g Topical TID  fluticasone furoate-vilanterol  1 puff Inhalation Daily   heparin  5,000 Units Subcutaneous Q8H   vitamin B-12  500 mcg Oral Daily    Continuous Infusions:  sodium chloride 75 mL/hr at 07/18/21 0110   ceFEPime (MAXIPIME) IV Stopped (07/18/21 1039)     LOS: 1 day     Briant Cedar, MD [facility]  If 7PM-7AM, please contact night-coverage [email or web address] 07/18/2021, 2:03 PM

## 2021-07-18 NOTE — H&P (Signed)
TRH H&P    Patient Demographics:    Debra Pratt, is a 87 y.o. female  MRN: [id number]  DOB - Jun 08, 1925  Admit Date - 07/17/2021  Referring MD/NP/PA: Criss Alvine  Outpatient Primary MD for the patient is Patient, No Pcp Per (Inactive)  Patient coming from: SNF  Chief complaint- fever   HPI:    Debra Pratt  is a 87 y.o. female, with history of dementia presents the ED with a chief complaint of fever.  History is limited due to patient's dementia.  The skilled nursing facility-[facility]-reported that she had a temperature of 100 that limits today.  She vomited prior to being sent to the ED.  Intermittent cough and wheeze as well.  They think the patient has also had more weakness than normal for her and has lost her appetite.  When I examined patient she is very hard of hearing.  She reports she is not feeling sick and she is in no pain.  No further history could be obtained.  Patient is DNR.  In the ED Temp 101.3, respiratory rate as high as 32, heart rate as high as 131, blood pressure as low as 97/65, satting at 96% on 2 L nasal cannula Leukocytosis 15.9, hemoglobin 8.8 Chemistry panel is unremarkable Negative respiratory panel Indicative of UTI Urine culture pending Blood culture pending Chest x-ray shows mild interstitial opacity without focal airspace disease that could indicate viral infection Tylenol, cefepime, normal saline, vancomycin given in the ED Admission requested for management of sepsis    Review of systems:    Review of systems cannot be obtained secondary to patient dementia    Past History of the following :    Past Medical History:  Diagnosis Date   Dementia (HCC)       History reviewed. No pertinent surgical history.    Social History:      Social History   Tobacco Use   Smoking status: Former    Years: 20.00    Types: Cigarettes   Smokeless tobacco: Never   Substance Use Topics   Alcohol use: Not Currently       Family History :    History reviewed. No pertinent family history. Family history could not be obtained secondary to patient dementia   Home Medications:   Prior to Admission medications   Medication Sig Start Date End Date Taking? Authorizing Provider  acetaminophen (TYLENOL) 500 MG tablet Take 500 mg by mouth every 6 (six) hours as needed for mild pain, fever or headache.    [provider]  albuterol (VENTOLIN HFA) 108 (90 Base) MCG/ACT inhaler Inhale 1-2 puffs into the lungs every 6 (six) hours as needed for wheezing or shortness of breath. 05/12/21   [provider]  amLODipine (NORVASC) 2.5 MG tablet Take 1 tablet (2.5 mg total) by mouth daily. 05/19/21 05/19/22  Erick Blinks, MD  aspirin EC 81 MG tablet Take 1 tablet (81 mg total) by mouth daily. Swallow whole. 05/19/21 05/19/22  Erick Blinks, MD  atorvastatin (LIPITOR) 40 MG tablet Take 1 tablet (40  mg total) by mouth daily. 05/19/21 05/19/22  Erick Blinks, MD  Calcium Carbonate (CALCIUM 500 PO) Take 1 tablet by mouth daily.    [provider]  Cholecalciferol (D3 5000) 125 MCG (5000 UT) capsule Take 5,000 Units by mouth daily.    [provider]  clopidogrel (PLAVIX) 75 MG tablet Take 1 tablet (75 mg total) by mouth daily with breakfast. 05/20/21   Erick Blinks, MD  SYMBICORT 80-4.5 MCG/ACT inhaler Inhale 2 puffs into the lungs 2 (two) times daily. 04/28/21   [provider]  vitamin B-12 (CYANOCOBALAMIN) 500 MCG tablet Take 500 mcg by mouth daily. 11/27/20   [provider]     Allergies:    No Known Allergies   Physical Exam:   Vitals  Blood pressure 116/69, pulse (!) 101, temperature 98.6 F (37 C), temperature source Rectal, resp. rate 17, height 5\' 7"  (1.702 m), weight 90.7 kg, SpO2 97 %.  1.  General: Patient lying supine in bed,  no acute distress   2. Psychiatric: Alert and oriented to person,  mood and behavior normal for situation, pleasant and cooperative with exam   3. Neurologic: Speech and language are normal, face is symmetric, very hard of hearing moves all 4 extremities voluntarily, at baseline without acute deficits on limited exam   4. HEENMT:  Head is atraumatic, normocephalic, pupils reactive to light, neck is supple, trachea is midline, mucous membranes are moist   5. Respiratory : Mild rhonchi on the left, no wheezing, or rales, no cyanosis, no increase in work of breathing or accessory muscle use   6. Cardiovascular : Heart rate normal, rhythm is regular, no murmurs, rubs or gallops, no peripheral edema, peripheral pulses palpated   7. Gastrointestinal:  Abdomen is soft, nondistended, nontender to palpation bowel sounds active, no masses or organomegaly palpated   8. Skin:  Skin is warm, dry and intact without rashes, acute lesions, or ulcers on limited exam   9.Musculoskeletal:  No acute deformities or trauma, no asymmetry in tone, no peripheral edema, peripheral pulses palpated, no tenderness to palpation in the extremities     Data Review:    CBC Recent Labs  Lab 07/17/21 2055 07/18/21 0417  WBC 15.9* 14.6*  HGB 8.8* 8.2*  HCT 28.4* 27.6*  PLT 301 295  MCV 93.1 93.6  MCH 28.9 27.8  MCHC 31.0 29.7*  RDW 14.0 14.0  LYMPHSABS 1.8 3.2  MONOABS 1.2* 1.2*  EOSABS 0.1 0.1  BASOSABS 0.1 0.1   ------------------------------------------------------------------------------------------------------------------  Results for orders placed or performed during the hospital encounter of 07/17/21 (from the past 48 hour(s))  Culture, blood (Routine x 2)     Status: None (Preliminary result)   Collection Time: 07/17/21  8:12 PM   Specimen: BLOOD  Result Value Ref Range   Specimen Description BLOOD BLOOD LEFT FOREARM    Special Requests      BOTTLES DRAWN AEROBIC AND ANAEROBIC Blood Culture adequate volume Performed at [facility], [address]  [place], [place]    Culture PENDING    Report Status PENDING   Resp Panel by RT-PCR (Flu A&B, Covid) Nasopharyngeal Swab     Status: None   Collection Time: 07/17/21  8:48 PM   Specimen: Nasopharyngeal Swab; Nasopharyngeal(NP) swabs in vial transport medium  Result Value Ref Range   SARS Coronavirus 2 by RT PCR NEGATIVE NEGATIVE    Comment: (NOTE) SARS-CoV-2 target nucleic acids are NOT DETECTED.  The SARS-CoV-2 RNA is generally detectable in upper  respiratory specimens during the acute phase of infection. The lowest concentration of SARS-CoV-2 viral copies this assay can detect is 138 copies/mL. A negative result does not preclude SARS-Cov-2 infection and should not be used as the sole basis for treatment or other patient management decisions. A negative result may occur with  improper specimen collection/handling, submission of specimen other than nasopharyngeal swab, presence of viral mutation(s) within the areas targeted by this assay, and inadequate number of viral copies(<138 copies/mL). A negative result must be combined with clinical observations, patient history, and epidemiological information. The expected result is Negative.  Fact Sheet for Patients:  BloggerCourse.com  Fact Sheet for Healthcare Providers:  SeriousBroker.it  This test is no t yet approved or cleared by the [place] FDA and  has been authorized for detection and/or diagnosis of SARS-CoV-2 by FDA under an Emergency Use Authorization (EUA). This EUA will remain  in effect (meaning this test can be used) for the duration of the COVID-19 declaration under Section 564(b)(1) of the Act, 21 U.S.C.section 360bbb-3(b)(1), unless the authorization is terminated  or revoked sooner.       Influenza A by PCR NEGATIVE NEGATIVE   Influenza B by PCR NEGATIVE NEGATIVE    Comment: (NOTE) The Xpert Xpress SARS-CoV-2/FLU/RSV plus assay is intended as an  aid in the diagnosis of influenza from Nasopharyngeal swab specimens and should not be used as a sole basis for treatment. Nasal washings and aspirates are unacceptable for Xpert Xpress SARS-CoV-2/FLU/RSV testing.  Fact Sheet for Patients: BloggerCourse.com  Fact Sheet for Healthcare Providers: SeriousBroker.it  This test is not yet approved or cleared by the [place] FDA and has been authorized for detection and/or diagnosis of SARS-CoV-2 by FDA under an Emergency Use Authorization (EUA). This EUA will remain in effect (meaning this test can be used) for the duration of the COVID-19 declaration under Section 564(b)(1) of the Act, 21 U.S.C. section 360bbb-3(b)(1), unless the authorization is terminated or revoked.  Performed at [facility], [address], [place], [place] [address]   Comprehensive metabolic panel     Status: Abnormal   Collection Time: 07/17/21  8:55 PM  Result Value Ref Range   Sodium 137 135 - 145 mmol/L   Potassium 4.0 3.5 - 5.1 mmol/L   Chloride 106 98 - 111 mmol/L   CO2 22 22 - 32 mmol/L   Glucose, Bld 121 (H) 70 - 99 mg/dL    Comment: Glucose reference range applies only to samples taken after fasting for at least 8 hours.   BUN 34 (H) 8 - 23 mg/dL   Creatinine, Ser 8.34 (H) 0.44 - 1.00 mg/dL   Calcium 8.3 (L) 8.9 - 10.3 mg/dL   Total Protein 7.3 6.5 - 8.1 g/dL   Albumin 2.9 (L) 3.5 - 5.0 g/dL   AST 21 15 - 41 U/L   ALT 17 0 - 44 U/L   Alkaline Phosphatase 64 38 - 126 U/L   Total Bilirubin 0.5 0.3 - 1.2 mg/dL   GFR, Estimated 46 (L) >60 mL/min    Comment: (NOTE) Calculated using the CKD-EPI Creatinine Equation (2021)    Anion gap 9 5 - 15    Comment: Performed at [facility], [address], [place], [place] [address]  Lactic acid, plasma     Status: None   Collection Time: 07/17/21  8:55 PM  Result Value Ref Range   Lactic Acid, Venous 1.0 0.5 - 1.9 mmol/L    Comment: Performed at [facility],  [address], [place], [place] [address]  CBC with Differential     Status: Abnormal   Collection Time: 07/17/21  8:55 PM  Result Value Ref Range   WBC 15.9 (H) 4.0 - 10.5 K/uL   RBC 3.05 (L) 3.87 - 5.11 MIL/uL   Hemoglobin 8.8 (L) 12.0 - 15.0 g/dL   HCT 60.4 (L) 54.0 - 98.1 %   MCV 93.1 80.0 - 100.0 fL   MCH 28.9 26.0 - 34.0 pg   MCHC 31.0 30.0 - 36.0 g/dL   RDW 19.1 47.8 - 29.5 %   Platelets 301 150 - 400 K/uL   nRBC 0.0 0.0 - 0.2 %   Neutrophils Relative % 79 %   Neutro Abs 12.6 (H) 1.7 - 7.7 K/uL   Lymphocytes Relative 12 %   Lymphs Abs 1.8 0.7 - 4.0 K/uL   Monocytes Relative 7 %   Monocytes Absolute 1.2 (H) 0.1 - 1.0 K/uL   Eosinophils Relative 1 %   Eosinophils Absolute 0.1 0.0 - 0.5 K/uL   Basophils Relative 0 %   Basophils Absolute 0.1 0.0 - 0.1 K/uL   Immature Granulocytes 1 %   Abs Immature Granulocytes 0.10 (H) 0.00 - 0.07 K/uL    Comment: Performed at [facility], [address] [place], [place] [address]  Protime-INR     Status: None   Collection Time: 07/17/21  8:55 PM  Result Value Ref Range   Prothrombin Time 14.4 11.4 - 15.2 seconds   INR 1.1 0.8 - 1.2    Comment: (NOTE) INR goal varies based on device and disease states. Performed at [facility], [address], [place], [place] [address]   Culture, blood (Routine x 2)     Status: None (Preliminary result)   Collection Time: 07/17/21  8:55 PM   Specimen: BLOOD  Result Value Ref Range   Specimen Description BLOOD BLOOD LEFT WRIST    Special Requests      BOTTLES DRAWN AEROBIC AND ANAEROBIC Blood Culture adequate volume Performed at [facility], [address], [place], [place] [address]    Culture PENDING    Report Status PENDING   Urinalysis, Routine w reflex microscopic Urine, In & Out Cath     Status: Abnormal   Collection Time: 07/17/21  9:15 PM  Result Value Ref Range   Color, Urine YELLOW YELLOW   APPearance TURBID (A) CLEAR   Specific Gravity, Urine 1.020 1.005 - 1.030   pH 7.0  5.0 - 8.0   Glucose, UA NEGATIVE NEGATIVE mg/dL   Hgb urine dipstick NEGATIVE NEGATIVE   Bilirubin Urine NEGATIVE NEGATIVE   Ketones, ur 5 (A) NEGATIVE mg/dL   Protein, ur 952 (A) NEGATIVE mg/dL   Nitrite NEGATIVE NEGATIVE   Leukocytes,Ua LARGE (A) NEGATIVE   RBC / HPF 11-20 0 - 5 RBC/hpf   WBC, UA >50 (H) 0 - 5 WBC/hpf   Bacteria, UA NONE SEEN NONE SEEN   Squamous Epithelial / LPF 0-5 0 - 5   WBC Clumps PRESENT    Mucus PRESENT     Comment: Performed at [facility], [address], [place], [place] [address]  Lactic acid, plasma     Status: None   Collection Time: 07/17/21 11:58 PM  Result Value Ref Range   Lactic Acid, Venous 0.7 0.5 - 1.9 mmol/L    Comment: Performed at [facility], [address], [place], [place] [address]  Protime-INR     Status: None   Collection Time: 07/18/21  4:17 AM  Result Value Ref Range   Prothrombin Time 14.7 11.4 - 15.2 seconds   INR 1.2 0.8 - 1.2    Comment: (NOTE) INR goal varies based on device and disease states. Performed at [facility], [address] [place], [place] [address]   Procalcitonin     Status: None   Collection Time: 07/18/21  4:17 AM  Result Value Ref Range   Procalcitonin 0.15 ng/mL    Comment:        Interpretation: PCT (Procalcitonin) <= 0.5 ng/mL: Systemic infection (sepsis) is not likely. Local bacterial infection is possible. (NOTE)       Sepsis PCT Algorithm           Lower Respiratory Tract                                      Infection PCT Algorithm    ----------------------------     ----------------------------         PCT < 0.25 ng/mL                PCT < 0.10 ng/mL          Strongly encourage             Strongly discourage   discontinuation of antibiotics    initiation of antibiotics    ----------------------------     -----------------------------       PCT 0.25 - 0.50 ng/mL            PCT 0.10 - 0.25 ng/mL               OR       >80% decrease in PCT            Discourage initiation of                                             antibiotics      Encourage discontinuation           of antibiotics    ----------------------------     -----------------------------         PCT >= 0.50 ng/mL              PCT 0.26 - 0.50 ng/mL               AND        <80% decrease in PCT             Encourage initiation of                                             antibiotics       Encourage continuation           of antibiotics    ----------------------------     -----------------------------        PCT >= 0.50 ng/mL                  PCT > 0.50 ng/mL               AND         increase in PCT  Strongly encourage                                      initiation of antibiotics    Strongly encourage escalation           of antibiotics                                     -----------------------------                                           PCT <= 0.25 ng/mL                                                 OR                                        > 80% decrease in PCT                                      Discontinue / Do not initiate                                             antibiotics  Performed at [facility], [address] [place], [place] [address]   Comprehensive metabolic panel     Status: Abnormal   Collection Time: 07/18/21  4:17 AM  Result Value Ref Range   Sodium 141 135 - 145 mmol/L   Potassium 4.0 3.5 - 5.1 mmol/L   Chloride 111 98 - 111 mmol/L   CO2 26 22 - 32 mmol/L   Glucose, Bld 107 (H) 70 - 99 mg/dL    Comment: Glucose reference range applies only to samples taken after fasting for at least 8 hours.   BUN 29 (H) 8 - 23 mg/dL   Creatinine, Ser 8.18 (H) 0.44 - 1.00 mg/dL   Calcium 8.0 (L) 8.9 - 10.3 mg/dL   Total Protein 6.6 6.5 - 8.1 g/dL   Albumin 2.6 (L) 3.5 - 5.0 g/dL   AST 18 15 - 41 U/L   ALT 17 0 - 44 U/L   Alkaline Phosphatase 60 38 - 126 U/L   Total Bilirubin 0.5 0.3 - 1.2 mg/dL   GFR, Estimated 50 (L) >60 mL/min    Comment: (NOTE) Calculated  using the CKD-EPI Creatinine Equation (2021)    Anion gap 4 (L) 5 - 15    Comment: Performed at [facility], [address] [place], [place] [address]  Magnesium     Status: None   Collection Time: 07/18/21  4:17 AM  Result Value Ref Range   Magnesium 2.2 1.7 - 2.4 mg/dL    Comment: Performed at [facility], [address], [place], [place] [address]  CBC WITH DIFFERENTIAL     Status: Abnormal  Collection Time: 07/18/21  4:17 AM  Result Value Ref Range   WBC 14.6 (H) 4.0 - 10.5 K/uL   RBC 2.95 (L) 3.87 - 5.11 MIL/uL   Hemoglobin 8.2 (L) 12.0 - 15.0 g/dL   HCT 46.9 (L) 50.7 - 22.5 %   MCV 93.6 80.0 - 100.0 fL   MCH 27.8 26.0 - 34.0 pg   MCHC 29.7 (L) 30.0 - 36.0 g/dL   RDW 75.0 51.8 - 33.5 %   Platelets 295 150 - 400 K/uL   nRBC 0.0 0.0 - 0.2 %   Neutrophils Relative % 67 %   Neutro Abs 9.9 (H) 1.7 - 7.7 K/uL   Lymphocytes Relative 22 %   Lymphs Abs 3.2 0.7 - 4.0 K/uL   Monocytes Relative 8 %   Monocytes Absolute 1.2 (H) 0.1 - 1.0 K/uL   Eosinophils Relative 1 %   Eosinophils Absolute 0.1 0.0 - 0.5 K/uL   Basophils Relative 1 %   Basophils Absolute 0.1 0.0 - 0.1 K/uL   Immature Granulocytes 1 %   Abs Immature Granulocytes 0.10 (H) 0.00 - 0.07 K/uL    Comment: Performed at [facility], [address], [place], [place] [address]    Chemistries  Recent Labs  Lab 07/17/21 2055 07/18/21 0417  NA 137 141  K 4.0 4.0  CL 106 111  CO2 22 26  GLUCOSE 121* 107*  BUN 34* 29*  CREATININE 1.11* 1.03*  CALCIUM 8.3* 8.0*  MG  --  2.2  AST 21 18  ALT 17 17  ALKPHOS 64 60  BILITOT 0.5 0.5   ------------------------------------------------------------------------------------------------------------------  ------------------------------------------------------------------------------------------------------------------ GFR: Estimated Creatinine Clearance: 38.6 mL/min (A) (by C-G formula based on SCr of 1.03 mg/dL (H)). Liver Function Tests: Recent Labs  Lab 07/17/21 2055  07/18/21 0417  AST 21 18  ALT 17 17  ALKPHOS 64 60  BILITOT 0.5 0.5  PROT 7.3 6.6  ALBUMIN 2.9* 2.6*   No results for input(s): LIPASE, AMYLASE in the last 168 hours. No results for input(s): AMMONIA in the last 168 hours. Coagulation Profile: Recent Labs  Lab 07/17/21 2055 07/18/21 0417  INR 1.1 1.2   Cardiac Enzymes: No results for input(s): CKTOTAL, CKMB, CKMBINDEX, TROPONINI in the last 168 hours. BNP (last 3 results) No results for input(s): PROBNP in the last 8760 hours. HbA1C: No results for input(s): HGBA1C in the last 72 hours. CBG: No results for input(s): GLUCAP in the last 168 hours. Lipid Profile: No results for input(s): CHOL, HDL, LDLCALC, TRIG, CHOLHDL, LDLDIRECT in the last 72 hours. Thyroid Function Tests: No results for input(s): TSH, T4TOTAL, FREET4, T3FREE, THYROIDAB in the last 72 hours. Anemia Panel: No results for input(s): VITAMINB12, FOLATE, FERRITIN, TIBC, IRON, RETICCTPCT in the last 72 hours.  --------------------------------------------------------------------------------------------------------------- Urine analysis:    Component Value Date/Time   COLORURINE YELLOW 07/17/2021 2115   APPEARANCEUR TURBID (A) 07/17/2021 2115   LABSPEC 1.020 07/17/2021 2115   PHURINE 7.0 07/17/2021 2115   GLUCOSEU NEGATIVE 07/17/2021 2115   HGBUR NEGATIVE 07/17/2021 2115   BILIRUBINUR NEGATIVE 07/17/2021 2115   KETONESUR 5 (A) 07/17/2021 2115   PROTEINUR 100 (A) 07/17/2021 2115   NITRITE NEGATIVE 07/17/2021 2115   LEUKOCYTESUR LARGE (A) 07/17/2021 2115      Imaging Results:    DG Chest Portable 1 View  Result Date: 07/17/2021 CLINICAL DATA:  Sepsis EXAM: PORTABLE CHEST 1 VIEW COMPARISON:  05/18/2021 FINDINGS: Diffuse mild interstitial opacity. Cardiomediastinal contours are normal. No pleural effusion or pneumothorax. No focal airspace  consolidation. IMPRESSION: Diffuse mild interstitial opacity without focal airspace disease. This could indicate  viral infection. Electronically Signed   By: Deatra Robinson M.D.   On: 07/17/2021 21:25       Assessment & Plan:    Active Problems:   Sepsis (HCC)   Essential hypertension   Acute respiratory failure with hypoxia (HCC)   Moderate protein-calorie malnutrition (HCC)   Sepsis Sepsis secondary to UTI versus pneumonia SIRS criteria temp 101.3, respiratory rate 32, heart rate 131, leukocytosis 15.9 Life threatening organ dysfunction 2/2 infection is evidenced by: Respiratory failure requiring 2 L nasal cannula Antibiotics started vancomycin and cefepime Fluids given prior to admission 1 L normal saline Sepsis order set utilized Monitor this patient on telemetry Urine culture, blood culture, expectorated sputum culture pending Hypertension Holding amlodipine as patient's blood pressure is soft in the ED Acute hypoxic respiratory failure Secondary to sepsis Normalized on 2 L nasal cannula Wean off O2 as tolerated Continue to monitor Moderate protein calorie malnutrition Encourage p.o. intake Hyperlipidemia Continue statin   DVT Prophylaxis-   Heparin- SCDs   AM Labs Ordered, also please review Full Orders  Family Communication: No family at bedside Code Status: DNR  Admission status: Inpatient :The appropriate admission status for this patient is INPATIENT. Inpatient status is judged to be reasonable and necessary in order to provide the required intensity of service to ensure the patient's safety. The patient's presenting symptoms, physical exam findings, and initial radiographic and laboratory data in the context of their chronic comorbidities is felt to place them at high risk for further clinical deterioration. Furthermore, it is not anticipated that the patient will be medically stable for discharge from the hospital within 2 midnights of admission. The following factors support the admission status of inpatient.     The patient's presenting symptoms include fever. The  worrisome physical exam findings include temperature 101.3, hypoxia. The initial radiographic and laboratory data are worrisome because of UTI, pneumonia on chest x-ray. The chronic co-morbidities include dementia, hypertension, hyperlipidemia, COPD.       * I certify that at the point of admission it is my clinical judgment that the patient will require inpatient hospital care spanning beyond 2 midnights from the point of admission due to high intensity of service, high risk for further deterioration and high frequency of surveillance required.*  Time spent in minutes : 65   Humberto Addo B Zierle-Ghosh DO

## 2021-07-18 NOTE — ED Notes (Signed)
POA requested to be updated when pt is placed in a room. [employer] [phone]

## 2021-07-18 NOTE — ED Notes (Signed)
Pt daughter in law [facility]) called in for update. Provided information that pt will be admitted once bed upstairs becomes available.

## 2021-07-18 NOTE — ED Notes (Signed)
DNR band placed on pt's right wrist 

## 2021-07-18 NOTE — ED Notes (Signed)
Date and time results received: 07/18/21 1557 (use smartphrase ".now" to insert current time)  Test: positive aerobic blood  Critical Value: + blood culture Gram positive cocci  Name of Provider Notified: Dr Sharolyn Douglas  Orders Received? Or Actions Taken?: see chart

## 2021-07-19 DIAGNOSIS — N390 Urinary tract infection, site not specified: Secondary | ICD-10-CM

## 2021-07-19 LAB — BASIC METABOLIC PANEL
Anion gap: 8 (ref 5–15)
BUN: 26 mg/dL — ABNORMAL HIGH (ref 8–23)
CO2: 20 mmol/L — ABNORMAL LOW (ref 22–32)
Calcium: 8.1 mg/dL — ABNORMAL LOW (ref 8.9–10.3)
Chloride: 111 mmol/L (ref 98–111)
Creatinine, Ser: 0.91 mg/dL (ref 0.44–1.00)
GFR, Estimated: 58 mL/min — ABNORMAL LOW (ref >60)
Glucose, Bld: 89 mg/dL (ref 70–99)
Potassium: 3.9 mmol/L (ref 3.5–5.1)
Sodium: 139 mmol/L (ref 135–145)

## 2021-07-19 LAB — CBC WITH DIFFERENTIAL/PLATELET
Abs Immature Granulocytes: 0.08 10*3/uL — ABNORMAL HIGH (ref 0.00–0.07)
Basophils Absolute: 0.1 10*3/uL (ref 0.0–0.1)
Basophils Relative: 1 %
Eosinophils Absolute: 0.3 10*3/uL (ref 0.0–0.5)
Eosinophils Relative: 2 %
HCT: 26.9 % — ABNORMAL LOW (ref 36.0–46.0)
Hemoglobin: 8 g/dL — ABNORMAL LOW (ref 12.0–15.0)
Immature Granulocytes: 1 %
Lymphocytes Relative: 20 %
Lymphs Abs: 2.7 10*3/uL (ref 0.7–4.0)
MCH: 27.9 pg (ref 26.0–34.0)
MCHC: 29.7 g/dL — ABNORMAL LOW (ref 30.0–36.0)
MCV: 93.7 fL (ref 80.0–100.0)
Monocytes Absolute: 1.2 10*3/uL — ABNORMAL HIGH (ref 0.1–1.0)
Monocytes Relative: 9 %
Neutro Abs: 8.8 10*3/uL — ABNORMAL HIGH (ref 1.7–7.7)
Neutrophils Relative %: 67 %
Platelets: 299 10*3/uL (ref 150–400)
RBC: 2.87 MIL/uL — ABNORMAL LOW (ref 3.87–5.11)
RDW: 13.9 % (ref 11.5–15.5)
WBC: 13.1 10*3/uL — ABNORMAL HIGH (ref 4.0–10.5)
nRBC: 0 % (ref 0.0–0.2)

## 2021-07-19 LAB — IRON AND TIBC
Iron: 11 ug/dL — ABNORMAL LOW (ref 28–170)
Saturation Ratios: 4 % — ABNORMAL LOW (ref 10.4–31.8)
TIBC: 289 ug/dL (ref 250–450)
UIBC: 278 ug/dL

## 2021-07-19 LAB — FOLATE: Folate: 15.3 ng/mL (ref >5.9)

## 2021-07-19 LAB — FERRITIN: Ferritin: 100 ng/mL (ref 11–307)

## 2021-07-19 LAB — VITAMIN B12: Vitamin B-12: 1200 pg/mL — ABNORMAL HIGH (ref 180–914)

## 2021-07-19 MED ORDER — FERROUS SULFATE 325 (65 FE) MG PO TABS
325.0000 mg | ORAL_TABLET | Freq: Every day | ORAL | Status: DC
  Administered 2021-07-20 – 2021-07-22 (×3): 325 mg via ORAL
  Filled 2021-07-19 (×3): qty 1

## 2021-07-19 MED ORDER — SODIUM CHLORIDE 0.9 % IV SOLN
INTRAVENOUS | Status: DC | PRN
  Administered 2021-07-19: 250 mL via INTRAVENOUS

## 2021-07-19 NOTE — Progress Notes (Signed)
PROGRESS NOTE  Debra Pratt NFA:[id number] DOB: 25-Feb-1925 DOA: 07/17/2021 PCP: Patient, No Pcp Per (Inactive)  HPI/Recap of past 24 hours: Debra Pratt  is a 87 y.o. female, with history of dementia presents the ED with a chief complaint of fever. History is limited due to patient's dementia. Pt resides at SNF facility-[facility], reported that she had a temperature of 100 and had an episode of vomiting, cough, wheezing, loss of appetite and generalized weakness. Of note, pt is very HOH. No further history could be obtained. In the ED, temp 101.3, respiratory rate as high as 32, heart rate as high as 131, blood pressure as low as 97/65, satting at 96% on 2 L nasal cannula Leukocytosis 15.9, hemoglobin 8.8 down from about 12 in 8/22. Flu and covid negative. UA positive for UTI, UC pending. Chest x-ray shows mild interstitial opacity without focal airspace disease that could indicate viral infection. Patient admitted for further management.    Today, patient noted to be sleeping this a.m., was noted to be up all night, probably sundowning.  Easily arousable, denied any new complaints.    Assessment/Plan: Active Problems:   Sepsis (HCC)   Essential hypertension   Acute respiratory failure with hypoxia (HCC)   Moderate protein-calorie malnutrition (HCC)   Sepsis likely 2/2 UTI Vs pneumonia On admission, febrile, respiratory rate 32, heart rate 131, leukocytosis 15.9 Last temp spike was 101.3 on 04/16/2021, with leukocytosis LA WNL, procalcitonin 0.15, will trend UA positive for UTI, UC growing >100,000 Proteus mirabilis BC x2, 1 set growing staph epidermidis, most likely contaminant, will repeat BC x2 Chest x-ray shows mild interstitial opacity without focal airspace disease that could indicate viral infection COVID, flu negative, respiratory viral panel negative S/p IV fluids Continue cefepime, d/c vanc Telemetry, monitor closely  Hypertension BP stable Hold home amlodipine in the  setting of sepsis as well  Normocytic anemia Drop in hemoglobin from 12 in 8/22 to 8.8 ??  Recently started on Plavix on 8/22 Anemia panel showed iron 11, saturation 4%, FOBT pending Oral iron supplementation Daily CBC Monitor for any signs of bleeding, hold DVT prophylaxis for now Due to recent CVA, unable to hold Plavix  History of CVA HLD Noted acute/subacute CVA on 8/22 Continue Lipitor, Plavix, hold aspirin  Obesity Lifestyle modification advised    Estimated body mass index is 29.18 kg/m as calculated from the following:   Height as of this encounter: 5\' 7"  (1.702 m).   Weight as of this encounter: 84.5 kg.     Code Status: DNR  Family Communication: None at bedside  Disposition Plan: Status is: Inpatient  Remains inpatient appropriate because: Level of care, sepsis    Consultants: None  Procedures: None  Antimicrobials: Cefepime  DVT prophylaxis: SCDs for now   Objective: Vitals:   07/18/21 2300 07/19/21 0620 07/19/21 0949 07/19/21 1443  BP: 108/89 125/66  124/68  Pulse: 90 74  75  Resp: 20 17  18   Temp: 98.1 F (36.7 C) 98.6 F (37 C)  98.7 F (37.1 C)  TempSrc: Axillary   Oral  SpO2: 99% 97% 96% 96%  Weight:      Height:        Intake/Output Summary (Last 24 hours) at 07/19/2021 1744 Last data filed at 07/19/2021 1312 Gross per 24 hour  Intake 453.03 ml  Output 1150 ml  Net -696.97 ml   Filed Weights   07/17/21 2026 07/18/21 1737  Weight: 90.7 kg 84.5 kg    Exam: General: NAD, HOH,  alert, awake, not oriented Cardiovascular: S1, S2 present Respiratory: CTAB Abdomen: Soft, nontender, nondistended, bowel sounds present Musculoskeletal: No bilateral pedal edema noted Skin: Normal Psychiatry: Unable to assess    Data Reviewed: CBC: Recent Labs  Lab 07/17/21 2055 07/18/21 0417 07/19/21 0647  WBC 15.9* 14.6* 13.1*  NEUTROABS 12.6* 9.9* 8.8*  HGB 8.8* 8.2* 8.0*  HCT 28.4* 27.6* 26.9*  MCV 93.1 93.6 93.7  PLT 301 295  299   Basic Metabolic Panel: Recent Labs  Lab 07/17/21 2055 07/18/21 0417 07/19/21 0647  NA 137 141 139  K 4.0 4.0 3.9  CL 106 111 111  CO2 22 26 20*  GLUCOSE 121* 107* 89  BUN 34* 29* 26*  CREATININE 1.11* 1.03* 0.91  CALCIUM 8.3* 8.0* 8.1*  MG  --  2.2  --    GFR: Estimated Creatinine Clearance: 42.3 mL/min (by C-G formula based on SCr of 0.91 mg/dL). Liver Function Tests: Recent Labs  Lab 07/17/21 2055 07/18/21 0417  AST 21 18  ALT 17 17  ALKPHOS 64 60  BILITOT 0.5 0.5  PROT 7.3 6.6  ALBUMIN 2.9* 2.6*   No results for input(s): LIPASE, AMYLASE in the last 168 hours. No results for input(s): AMMONIA in the last 168 hours. Coagulation Profile: Recent Labs  Lab 07/17/21 2055 07/18/21 0417  INR 1.1 1.2   Cardiac Enzymes: No results for input(s): CKTOTAL, CKMB, CKMBINDEX, TROPONINI in the last 168 hours. BNP (last 3 results) No results for input(s): PROBNP in the last 8760 hours. HbA1C: No results for input(s): HGBA1C in the last 72 hours. CBG: No results for input(s): GLUCAP in the last 168 hours. Lipid Profile: No results for input(s): CHOL, HDL, LDLCALC, TRIG, CHOLHDL, LDLDIRECT in the last 72 hours. Thyroid Function Tests: No results for input(s): TSH, T4TOTAL, FREET4, T3FREE, THYROIDAB in the last 72 hours. Anemia Panel: Recent Labs    07/19/21 0647  VITAMINB12 1,200*  FOLATE 15.3  FERRITIN 100  TIBC 289  IRON 11*   Urine analysis:    Component Value Date/Time   COLORURINE YELLOW 07/17/2021 2115   APPEARANCEUR TURBID (A) 07/17/2021 2115   LABSPEC 1.020 07/17/2021 2115   PHURINE 7.0 07/17/2021 2115   GLUCOSEU NEGATIVE 07/17/2021 2115   HGBUR NEGATIVE 07/17/2021 2115   BILIRUBINUR NEGATIVE 07/17/2021 2115   KETONESUR 5 (A) 07/17/2021 2115   PROTEINUR 100 (A) 07/17/2021 2115   NITRITE NEGATIVE 07/17/2021 2115   LEUKOCYTESUR LARGE (A) 07/17/2021 2115   Sepsis Labs: @LABRCNTIP (procalcitonin:4,lacticidven:4)  ) Recent Results (from the  past 240 hour(s))  Culture, blood (Routine x 2)     Status: None (Preliminary result)   Collection Time: 07/17/21  8:12 PM   Specimen: BLOOD  Result Value Ref Range Status   Specimen Description BLOOD BLOOD LEFT FOREARM  Final   Special Requests   Final    BOTTLES DRAWN AEROBIC AND ANAEROBIC Blood Culture adequate volume   Culture   Final    NO GROWTH 2 DAYS Performed at [facility], [address] [place], [place]    Report Status PENDING  Incomplete  Resp Panel by RT-PCR (Flu A&B, Covid) Nasopharyngeal Swab     Status: None   Collection Time: 07/17/21  8:48 PM   Specimen: Nasopharyngeal Swab; Nasopharyngeal(NP) swabs in vial transport medium  Result Value Ref Range Status   SARS Coronavirus 2 by RT PCR NEGATIVE NEGATIVE Final    Comment: (NOTE) SARS-CoV-2 target nucleic acids are NOT DETECTED.  The SARS-CoV-2 RNA is generally detectable in  upper respiratory specimens during the acute phase of infection. The lowest concentration of SARS-CoV-2 viral copies this assay can detect is 138 copies/mL. A negative result does not preclude SARS-Cov-2 infection and should not be used as the sole basis for treatment or other patient management decisions. A negative result may occur with  improper specimen collection/handling, submission of specimen other than nasopharyngeal swab, presence of viral mutation(s) within the areas targeted by this assay, and inadequate number of viral copies(<138 copies/mL). A negative result must be combined with clinical observations, patient history, and epidemiological information. The expected result is Negative.  Fact Sheet for Patients:  BloggerCourse.com  Fact Sheet for Healthcare Providers:  SeriousBroker.it  This test is no t yet approved or cleared by the [place] FDA and  has been authorized for detection and/or diagnosis of SARS-CoV-2 by FDA under an Emergency Use Authorization  (EUA). This EUA will remain  in effect (meaning this test can be used) for the duration of the COVID-19 declaration under Section 564(b)(1) of the Act, 21 U.S.C.section 360bbb-3(b)(1), unless the authorization is terminated  or revoked sooner.       Influenza A by PCR NEGATIVE NEGATIVE Final   Influenza B by PCR NEGATIVE NEGATIVE Final    Comment: (NOTE) The Xpert Xpress SARS-CoV-2/FLU/RSV plus assay is intended as an aid in the diagnosis of influenza from Nasopharyngeal swab specimens and should not be used as a sole basis for treatment. Nasal washings and aspirates are unacceptable for Xpert Xpress SARS-CoV-2/FLU/RSV testing.  Fact Sheet for Patients: BloggerCourse.com  Fact Sheet for Healthcare Providers: SeriousBroker.it  This test is not yet approved or cleared by the [place] FDA and has been authorized for detection and/or diagnosis of SARS-CoV-2 by FDA under an Emergency Use Authorization (EUA). This EUA will remain in effect (meaning this test can be used) for the duration of the COVID-19 declaration under Section 564(b)(1) of the Act, 21 U.S.C. section 360bbb-3(b)(1), unless the authorization is terminated or revoked.  Performed at [facility], [address] [place], [place] [address]   Culture, blood (Routine x 2)     Status: Abnormal (Preliminary result)   Collection Time: 07/17/21  8:55 PM   Specimen: BLOOD  Result Value Ref Range Status   Specimen Description   Final    BLOOD BLOOD LEFT WRIST Performed at [facility], [address], [place], [place] [address]    Special Requests   Final    BOTTLES DRAWN AEROBIC AND ANAEROBIC Blood Culture adequate volume Performed at [facility], [address], [place], [place] [address]    Culture  Setup Time   Final    GRAM POSITIVE COCCI IN BOTH AEROBIC AND ANAEROBIC BOTTLES Gram Stain Report Called to,Read Back By and Verified With: DOSS,M AT 1424 ON 10.17.22  BY RUCINSKI,B CRITICAL RESULT CALLED TO, READ BACK BY AND VERIFIED WITH: MANDY WAX RN 07/18/2021 @2030  BY JW    Culture (A)  Final    STAPHYLOCOCCUS HOMINIS CULTURE REINCUBATED FOR BETTER GROWTH Performed at [facility], [address], [place], [place] [address]    Report Status PENDING  Incomplete  Blood Culture ID Panel (Reflexed)     Status: Abnormal   Collection Time: 07/17/21  8:55 PM  Result Value Ref Range Status   Enterococcus faecalis NOT DETECTED NOT DETECTED Final   Enterococcus Faecium NOT DETECTED NOT DETECTED Final   Listeria monocytogenes NOT DETECTED NOT DETECTED Final   Staphylococcus species DETECTED (A) NOT DETECTED Final  Comment: CRITICAL RESULT CALLED TO, READ BACK BY AND VERIFIED WITH: MANDY WAX RN 07/18/2021 @2030  BY JW    Staphylococcus aureus (BCID) NOT DETECTED NOT DETECTED Final   Staphylococcus epidermidis DETECTED (A) NOT DETECTED Final    Comment: CRITICAL RESULT CALLED TO, READ BACK BY AND VERIFIED WITH: MANDY WAX RN 07/18/2021 @2030  BY JW    Staphylococcus lugdunensis NOT DETECTED NOT DETECTED Final   Streptococcus species NOT DETECTED NOT DETECTED Final   Streptococcus agalactiae NOT DETECTED NOT DETECTED Final   Streptococcus pneumoniae NOT DETECTED NOT DETECTED Final   Streptococcus pyogenes NOT DETECTED NOT DETECTED Final   A.calcoaceticus-baumannii NOT DETECTED NOT DETECTED Final   Bacteroides fragilis NOT DETECTED NOT DETECTED Final   Enterobacterales NOT DETECTED NOT DETECTED Final   Enterobacter cloacae complex NOT DETECTED NOT DETECTED Final   Escherichia coli NOT DETECTED NOT DETECTED Final   Klebsiella aerogenes NOT DETECTED NOT DETECTED Final   Klebsiella oxytoca NOT DETECTED NOT DETECTED Final   Klebsiella pneumoniae NOT DETECTED NOT DETECTED Final   Proteus species NOT DETECTED NOT DETECTED Final   Salmonella species NOT DETECTED NOT DETECTED Final   Serratia marcescens NOT DETECTED NOT DETECTED Final   Haemophilus  influenzae NOT DETECTED NOT DETECTED Final   Neisseria meningitidis NOT DETECTED NOT DETECTED Final   Pseudomonas aeruginosa NOT DETECTED NOT DETECTED Final   Stenotrophomonas maltophilia NOT DETECTED NOT DETECTED Final   Candida albicans NOT DETECTED NOT DETECTED Final   Candida auris NOT DETECTED NOT DETECTED Final   Candida glabrata NOT DETECTED NOT DETECTED Final   Candida krusei NOT DETECTED NOT DETECTED Final   Candida parapsilosis NOT DETECTED NOT DETECTED Final   Candida tropicalis NOT DETECTED NOT DETECTED Final   Cryptococcus neoformans/gattii NOT DETECTED NOT DETECTED Final   Methicillin resistance mecA/C NOT DETECTED NOT DETECTED Final    Comment: Performed at [facility], [address] [place], [address] [place]  Urine Culture     Status: Abnormal (Preliminary result)   Collection Time: 07/17/21 11:00 PM   Specimen: Urine, Catheterized  Result Value Ref Range Status   Specimen Description   Final    URINE, CATHETERIZED Performed at [facility], [address], [place], [address] [place]    Special Requests   Final    NONE Performed at [facility], [address], [place], [address] [place]    Culture (A)  Final    >=100,000 COLONIES/mL PROTEUS MIRABILIS SUSCEPTIBILITIES TO FOLLOW Performed at [facility], [address] [place], [address] [place]    Report Status PENDING  Incomplete  Respiratory (~20 pathogens) panel by PCR     Status: None   Collection Time: 07/18/21 11:00 AM   Specimen: Nasopharyngeal Swab; Respiratory  Result Value Ref Range Status   Adenovirus NOT DETECTED NOT DETECTED Final   Coronavirus 229E NOT DETECTED NOT DETECTED Final    Comment: (NOTE) The Coronavirus on the Respiratory Panel, DOES NOT test for the novel  Coronavirus (2019 nCoV)    Coronavirus HKU1 NOT DETECTED NOT DETECTED Final   Coronavirus NL63 NOT DETECTED NOT DETECTED Final   Coronavirus OC43 NOT DETECTED NOT DETECTED Final   Metapneumovirus NOT DETECTED NOT  DETECTED Final   Rhinovirus / Enterovirus NOT DETECTED NOT DETECTED Final   Influenza A NOT DETECTED NOT DETECTED Final   Influenza B NOT DETECTED NOT DETECTED Final   Parainfluenza Virus 1 NOT DETECTED NOT DETECTED Final   Parainfluenza Virus 2 NOT DETECTED NOT DETECTED Final  Parainfluenza Virus 3 NOT DETECTED NOT DETECTED Final   Parainfluenza Virus 4 NOT DETECTED NOT DETECTED Final   Respiratory Syncytial Virus NOT DETECTED NOT DETECTED Final   Bordetella pertussis NOT DETECTED NOT DETECTED Final   Bordetella Parapertussis NOT DETECTED NOT DETECTED Final   Chlamydophila pneumoniae NOT DETECTED NOT DETECTED Final   Mycoplasma pneumoniae NOT DETECTED NOT DETECTED Final    Comment: Performed at [facility], [address], [place], [place] [address]  MRSA Next Gen by PCR, Nasal     Status: None   Collection Time: 07/18/21 11:00 AM   Specimen: Nasal Mucosa; Nasal Swab  Result Value Ref Range Status   MRSA by PCR Next Gen NOT DETECTED NOT DETECTED Final    Comment: (NOTE) The GeneXpert MRSA Assay (FDA approved for NASAL specimens only), is one component of a comprehensive MRSA colonization surveillance program. It is not intended to diagnose MRSA infection nor to guide or monitor treatment for MRSA infections. Test performance is not FDA approved in patients less than 28 years old. Performed at [facility], [address], [place], [place] [address]       Studies: No results found.  Scheduled Meds:  aspirin EC  81 mg Oral Daily   atorvastatin  40 mg Oral Daily   clopidogrel  75 mg Oral Q breakfast   diclofenac Sodium  2 g Topical TID   fluticasone furoate-vilanterol  1 puff Inhalation Daily   vitamin B-12  500 mcg Oral Daily    Continuous Infusions:  sodium chloride Stopped (07/19/21 1310)   ceFEPime (MAXIPIME) IV Stopped (07/19/21 1153)     LOS: 2 days     Briant Cedar, MD [facility]  If 7PM-7AM, please contact  night-coverage [email or web address] 07/19/2021, 5:44 PM

## 2021-07-19 NOTE — Plan of Care (Signed)
  Problem: Education: Goal: Knowledge of General Education information will improve Description Including pain rating scale, medication(s)/side effects and non-pharmacologic comfort measures Outcome: Progressing   Problem: Health Behavior/Discharge Planning: Goal: Ability to manage health-related needs will improve Outcome: Progressing   

## 2021-07-20 LAB — BASIC METABOLIC PANEL
Anion gap: 6 (ref 5–15)
BUN: 26 mg/dL — ABNORMAL HIGH (ref 8–23)
CO2: 23 mmol/L (ref 22–32)
Calcium: 8.2 mg/dL — ABNORMAL LOW (ref 8.9–10.3)
Chloride: 109 mmol/L (ref 98–111)
Creatinine, Ser: 0.91 mg/dL (ref 0.44–1.00)
GFR, Estimated: 58 mL/min — ABNORMAL LOW (ref >60)
Glucose, Bld: 102 mg/dL — ABNORMAL HIGH (ref 70–99)
Potassium: 3.7 mmol/L (ref 3.5–5.1)
Sodium: 138 mmol/L (ref 135–145)

## 2021-07-20 LAB — CBC WITH DIFFERENTIAL/PLATELET
Abs Immature Granulocytes: 0.09 10*3/uL — ABNORMAL HIGH (ref 0.00–0.07)
Basophils Absolute: 0.1 10*3/uL (ref 0.0–0.1)
Basophils Relative: 1 %
Eosinophils Absolute: 0.3 10*3/uL (ref 0.0–0.5)
Eosinophils Relative: 3 %
HCT: 27.6 % — ABNORMAL LOW (ref 36.0–46.0)
Hemoglobin: 8.3 g/dL — ABNORMAL LOW (ref 12.0–15.0)
Immature Granulocytes: 1 %
Lymphocytes Relative: 23 %
Lymphs Abs: 2.8 10*3/uL (ref 0.7–4.0)
MCH: 27.8 pg (ref 26.0–34.0)
MCHC: 30.1 g/dL (ref 30.0–36.0)
MCV: 92.3 fL (ref 80.0–100.0)
Monocytes Absolute: 1.2 10*3/uL — ABNORMAL HIGH (ref 0.1–1.0)
Monocytes Relative: 10 %
Neutro Abs: 7.9 10*3/uL — ABNORMAL HIGH (ref 1.7–7.7)
Neutrophils Relative %: 62 %
Platelets: 335 10*3/uL (ref 150–400)
RBC: 2.99 MIL/uL — ABNORMAL LOW (ref 3.87–5.11)
RDW: 13.9 % (ref 11.5–15.5)
WBC: 12.3 10*3/uL — ABNORMAL HIGH (ref 4.0–10.5)
nRBC: 0 % (ref 0.0–0.2)

## 2021-07-20 LAB — URINE CULTURE: Culture: >=100000 — AB

## 2021-07-20 NOTE — Evaluation (Addendum)
Physical Therapy Evaluation Patient Details Name: Debra Pratt MRN: [id number] DOB: 1925/03/02 Today's Date: 07/20/2021  History of Present Illness  Debra Pratt  is a 87 y.o. female, with history of dementia presents the ED with a chief complaint of fever.  History is limited due to patient's dementia.  The skilled nursing facility-[facility]-reported that she had a temperature of 100 that limits today.  She vomited prior to being sent to the ED.  Intermittent cough and wheeze as well.  They think the patient has also had more weakness than normal for her and has lost her appetite.  When I examined patient she is very hard of hearing.  She reports she is not feeling sick and she is in no pain.  No further history could be obtained.   Clinical Impression  Patient in bed awake, alert, and agreeable for therapy w/ ALF staff at bedside. Patient demonstrated B LE weakness, a narrow base of support, and a posterior lean w/ standing and sitting. Patient required repeated verbal and tactile cuing for B UE and B LE placement for transfers, frequent leaning backwards and limited to a few side steps using RW at bedside. Patient tolerated sitting up in chair after therapy w/ call bell and phone within reach, ALF staff still present. Patient will benefit from continued physical therapy in hospital and recommended venue below to increase strength, balance, endurance for safe ADLs and gait.      Recommendations for follow up therapy are one component of a multi-disciplinary discharge planning process, led by the attending physician.  Recommendations may be updated based on patient status, additional functional criteria and insurance authorization.  Follow Up Recommendations SNF;Supervision/Assistance - 24 hour    Equipment Recommendations  None recommended by PT    Recommendations for Other Services       Precautions / Restrictions Precautions Precautions: Fall Restrictions Weight Bearing Restrictions:  No      Mobility  Bed Mobility Overal bed mobility: Needs Assistance Bed Mobility: Supine to Sit;Rolling Rolling: Mod assist   Supine to sit: Mod assist     General bed mobility comments: HOB flat, assisted patient with one hand lowering B LE and the other supporting the patient's trunk    Transfers Overall transfer level: Needs assistance Equipment used: Rolling walker (2 wheeled) Transfers: Sit to/from [employer] Sit to Stand: Mod assist;Max assist Stand pivot transfers: Mod assist;Max assist       General transfer comment: Patient required repeated cuing for hand and foot placement, used RW, for bed to chair transfer.  Ambulation/Gait Ambulation/Gait assistance: Max assist Gait Distance (Feet): 5 Feet Assistive device: Rolling walker (2 wheeled) Gait Pattern/deviations: Decreased step length - left;Decreased step length - right;Decreased stride length Gait velocity: Decreased   General Gait Details: Slow labored movement, used RW, required repeated cuing for B UE and B LE placement.  Stairs            Wheelchair Mobility    Modified Rankin (Stroke Patients Only)       Balance Overall balance assessment: Needs assistance Sitting-balance support: Feet supported;Single extremity supported Sitting balance-Leahy Scale: Fair Sitting balance - Comments: at EOB   Standing balance support: Bilateral upper extremity supported;During functional activity Standing balance-Leahy Scale: Poor Standing balance comment: At bedside using RW                             Pertinent Vitals/Pain Pain Assessment: Faces Faces Pain Scale: Hurts little  more Pain Location: Complained of pain w/ movement of R LE, unable to describe the pain or give a location. Pain Descriptors / Indicators: Grimacing Pain Intervention(s): Limited activity within patient's tolerance;Monitored during session    Home Living Family/patient expects to be discharged  to:: Assisted living               Home Equipment: Walker - 2 wheels;Cane - single point;Wheelchair - manual Additional Comments: Patient came from an ALF. Cargivers from the ALF were present during the session.    Prior Function Level of Independence: Needs assistance   Gait / Transfers Assistance Needed: Patient has been using a wheelchair to get around for the last 4 months, prior to that patient was using a walker.  ADL's / Homemaking Assistance Needed: assisted by ALF staff  Comments: Patient is a poor historian and was unable to provide a hx but caregivers from ALF were present and able to provide a hx.     Hand Dominance        Extremity/Trunk Assessment   Upper Extremity Assessment Upper Extremity Assessment: Defer to OT evaluation    Lower Extremity Assessment Lower Extremity Assessment: Generalized weakness       Communication   Communication: HOH  Cognition Arousal/Alertness: Awake/alert Behavior During Therapy: WFL for tasks assessed/performed Overall Cognitive Status: Within Functional Limits for tasks assessed                                        General Comments      Exercises     Assessment/Plan    PT Assessment Patient needs continued PT services  PT Problem List Decreased strength;Decreased mobility;Decreased safety awareness;Decreased range of motion;Decreased coordination;Decreased activity tolerance;Decreased balance;Decreased knowledge of use of DME       PT Treatment Interventions DME instruction;Therapeutic exercise;Gait training;Balance training;Stair training;Functional mobility training;Therapeutic activities;Patient/family education    PT Goals (Current goals can be found in the Care Plan section)  Acute Rehab PT Goals Patient Stated Goal: Return to a SNF PT Goal Formulation: Patient unable to participate in goal setting Time For Goal Achievement: 08/03/21 Potential to Achieve Goals: Good    Frequency Min  3X/week   Barriers to discharge        Co-evaluation               AM-PAC PT "6 Clicks" Mobility  Outcome Measure Help needed turning from your back to your side while in a flat bed without using bedrails?: A Lot Help needed moving from lying on your back to sitting on the side of a flat bed without using bedrails?: A Lot Help needed moving to and from a bed to a chair (including a wheelchair)?: A Lot Help needed standing up from a chair using your arms (e.g., wheelchair or bedside chair)?: A Lot Help needed to walk in hospital room?: A Lot Help needed climbing 3-5 steps with a railing? : Total 6 Click Score: 11    End of Session Equipment Utilized During Treatment: Gait belt Activity Tolerance: Patient tolerated treatment well;No increased pain;Patient limited by fatigue Patient left: in chair;with call bell/phone within reach;with chair alarm set;with family/visitor present Nurse Communication: Mobility status PT Visit Diagnosis: Unsteadiness on feet (R26.81);Other abnormalities of gait and mobility (R26.89);Muscle weakness (generalized) (M62.81)    Time: 1410-1440 PT Time Calculation (min) (ACUTE ONLY): 30 min   Charges:   PT Evaluation $PT Eval Moderate  Complexity: 1 Mod PT Treatments $Therapeutic Activity: 23-37 mins        Cassie Jones, SPT  During this treatment session, the therapist was present, participating in and directing the treatment.  4:24 PM, 07/20/21 Ocie Bob, MPT Physical Therapist with [facility] [phone] office [phone] mobile phone

## 2021-07-20 NOTE — Progress Notes (Signed)
PROGRESS NOTE    Debra Pratt  NUU:[id number] DOB: 1925-05-12 DOA: 07/17/2021 PCP: Patient, No Pcp Per (Inactive)     Brief Narrative:  Debra Pratt  is a 87 y.o. female, with history of dementia presents the ED with a chief complaint of fever. History is limited due to patient's dementia. Pt resides at ALF [facility], reported that she had a temperature of 100 and had an episode of vomiting, cough, wheezing, loss of appetite and generalized weakness. Of note, pt is very HOH. No further history could be obtained. In the ED, temp 101.3, respiratory rate as high as 32, heart rate as high as 131, blood pressure as low as 97/65, satting at 96% on 2 L nasal cannula, leukocytosis 15.9, hemoglobin 8.8 down from about 12 in 8/22. Flu and covid negative. UA positive for UTI, culture pending. Chest x-ray shows mild interstitial opacity without focal airspace disease that could indicate viral infection. Patient admitted for further management.  New events last 24 hours / Subjective: Patient is a poor historian, asks why she is in the hospital.  When discussed with her that patient was here with an infection, likely urinary tract, patient is very surprised.  She has history of dementia and resides at ALF   Assessment & Plan:   Active Problems:   Sepsis (HCC)   Essential hypertension   Acute respiratory failure with hypoxia (HCC)   Moderate protein-calorie malnutrition (HCC)   Sepsis secondary to UTI +/- CAP -Sepsis present on admission admitted with tachycardia 131, tachypnea 32, leukocytosis 15.9 -Urine culture >100,000 Proteus mirabilis -Blood culture 1 set growing staph epidermidis, likely a contaminant, repeat blood cultures pending -Chest x-ray revealed mild interstitial opacity -COVID, flu, respiratory viral panel negative -Continue IV cefepime, likely de-escalate next 24 hours  Hypertension -Resume home amlodipine  Normocytic anemia -Drop in hemoglobin 12 --> 8.8 since August.  Patient  was recently started on Plavix.  Monitor signs of bleeding while on Plavix, check FOBT -Hemoglobin stable last 3 days  History of CVA -Continue Plavix, Lipitor  Dementia -Supportive care  CKD stage IIIa -Stable  DVT prophylaxis: SCDs Start: 07/18/21 0049  Code Status:     Code Status Orders  (From admission, onward)           Start     Ordered   07/18/21 0049  Do not attempt resuscitation (DNR)  Continuous       Question Answer Comment  In the event of cardiac or respiratory ARREST Do not call a "code blue"   In the event of cardiac or respiratory ARREST Do not perform Intubation, CPR, defibrillation or ACLS   In the event of cardiac or respiratory ARREST Use medication by any route, position, wound care, and other measures to relive pain and suffering. May use oxygen, suction and manual treatment of airway obstruction as needed for comfort.      07/18/21 0048           Code Status History     Date Active Date Inactive Code Status Order ID Comments User Context   05/18/2021 1733 05/19/2021 2121 DNR [id number]  Erick Blinks, MD Inpatient      Advance Directive Documentation    Flowsheet Row Most Recent Value  Type of Advance Directive Out of facility DNR (pink MOST or yellow form)  Pre-existing out of facility DNR order (yellow form or pink MOST form) Yellow form placed in chart (order not valid for inpatient use)  "MOST" Form in Place? --  Family Communication: No family at bedside Disposition Plan:  Status is: Inpatient  Remains inpatient appropriate because: Remains on IV antibiotics, awaiting PT OT evaluation to see if patient can return back to her ALF versus will need SNF      Consultants:  None  Procedures:  None  Antimicrobials:  Anti-infectives (From admission, onward)    Start     Dose/Rate Route Frequency Ordered Stop   07/18/21 2230  vancomycin (VANCOREADY) IVPB 1250 mg/250 mL  Status:  Discontinued       See Hyperspace for  full Linked Orders Report.   1,250 mg 166.7 mL/hr over 90 Minutes Intravenous Every 24 hours 07/17/21 2134 07/18/21 1250   07/17/21 2230  vancomycin (VANCOREADY) IVPB 1750 mg/350 mL       See Hyperspace for full Linked Orders Report.   1,750 mg 175 mL/hr over 120 Minutes Intravenous  Once 07/17/21 2134 07/18/21 0622   07/17/21 2200  ceFEPIme (MAXIPIME) 2 g in sodium chloride 0.9 % 100 mL IVPB        2 g 200 mL/hr over 30 Minutes Intravenous Every 12 hours 07/17/21 2134          Objective: Vitals:   07/19/21 1443 07/19/21 2050 07/20/21 0439 07/20/21 1014  BP: 124/68 (!) 148/61 122/62   Pulse: 75 82 85   Resp: 18 18 19    Temp: 98.7 F (37.1 C) 98.7 F (37.1 C) 98.3 F (36.8 C)   TempSrc: Oral Oral    SpO2: 96% 96% 98% 96%  Weight:      Height:        Intake/Output Summary (Last 24 hours) at 07/20/2021 1333 Last data filed at 07/20/2021 0900 Gross per 24 hour  Intake 480 ml  Output 1100 ml  Net -620 ml   Filed Weights   07/17/21 2026 07/18/21 1737  Weight: 90.7 kg 84.5 kg    Examination:  General exam: Appears calm and comfortable, hard of hearing Respiratory system: Clear to auscultation. Respiratory effort normal. No respiratory distress.  Cardiovascular system: S1 & S2 heard, RRR. No murmurs. No pedal edema. Gastrointestinal system: Abdomen is nondistended, soft and nontender. Normal bowel sounds heard. Central nervous system: Alert  Extremities: Symmetric in appearance  Skin: No rashes, lesions or ulcers on exposed skin  Psychiatry: Judgement and insight appear poor, dementia  Data Reviewed: I have personally reviewed following labs and imaging studies  CBC: Recent Labs  Lab 07/17/21 2055 07/18/21 0417 07/19/21 0647 07/20/21 0641  WBC 15.9* 14.6* 13.1* 12.3*  NEUTROABS 12.6* 9.9* 8.8* 7.9*  HGB 8.8* 8.2* 8.0* 8.3*  HCT 28.4* 27.6* 26.9* 27.6*  MCV 93.1 93.6 93.7 92.3  PLT 301 295 299 335   Basic Metabolic Panel: Recent Labs  Lab 07/17/21 2055  07/18/21 0417 07/19/21 0647 07/20/21 0641  NA 137 141 139 138  K 4.0 4.0 3.9 3.7  CL 106 111 111 109  CO2 22 26 20* 23  GLUCOSE 121* 107* 89 102*  BUN 34* 29* 26* 26*  CREATININE 1.11* 1.03* 0.91 0.91  CALCIUM 8.3* 8.0* 8.1* 8.2*  MG  --  2.2  --   --    GFR: Estimated Creatinine Clearance: 42.3 mL/min (by C-G formula based on SCr of 0.91 mg/dL). Liver Function Tests: Recent Labs  Lab 07/17/21 2055 07/18/21 0417  AST 21 18  ALT 17 17  ALKPHOS 64 60  BILITOT 0.5 0.5  PROT 7.3 6.6  ALBUMIN 2.9* 2.6*   No results for input(s): LIPASE, AMYLASE in  the last 168 hours. No results for input(s): AMMONIA in the last 168 hours. Coagulation Profile: Recent Labs  Lab 07/17/21 2055 07/18/21 0417  INR 1.1 1.2   Cardiac Enzymes: No results for input(s): CKTOTAL, CKMB, CKMBINDEX, TROPONINI in the last 168 hours. BNP (last 3 results) No results for input(s): PROBNP in the last 8760 hours. HbA1C: No results for input(s): HGBA1C in the last 72 hours. CBG: No results for input(s): GLUCAP in the last 168 hours. Lipid Profile: No results for input(s): CHOL, HDL, LDLCALC, TRIG, CHOLHDL, LDLDIRECT in the last 72 hours. Thyroid Function Tests: No results for input(s): TSH, T4TOTAL, FREET4, T3FREE, THYROIDAB in the last 72 hours. Anemia Panel: Recent Labs    07/19/21 0647  VITAMINB12 1,200*  FOLATE 15.3  FERRITIN 100  TIBC 289  IRON 11*   Sepsis Labs: Recent Labs  Lab 07/17/21 2055 07/17/21 2358 07/18/21 0417  PROCALCITON  --   --  0.15  LATICACIDVEN 1.0 0.7  --     Recent Results (from the past 240 hour(s))  Culture, blood (Routine x 2)     Status: None (Preliminary result)   Collection Time: 07/17/21  8:12 PM   Specimen: BLOOD  Result Value Ref Range Status   Specimen Description BLOOD BLOOD LEFT FOREARM  Final   Special Requests   Final    BOTTLES DRAWN AEROBIC AND ANAEROBIC Blood Culture adequate volume   Culture   Final    NO GROWTH 3 DAYS Performed at [facility], [address] [place], [place] [address]    Report Status PENDING  Incomplete  Resp Panel by RT-PCR (Flu A&B, Covid) Nasopharyngeal Swab     Status: None   Collection Time: 07/17/21  8:48 PM   Specimen: Nasopharyngeal Swab; Nasopharyngeal(NP) swabs in vial transport medium  Result Value Ref Range Status   SARS Coronavirus 2 by RT PCR NEGATIVE NEGATIVE Final    Comment: (NOTE) SARS-CoV-2 target nucleic acids are NOT DETECTED.  The SARS-CoV-2 RNA is generally detectable in upper respiratory specimens during the acute phase of infection. The lowest concentration of SARS-CoV-2 viral copies this assay can detect is 138 copies/mL. A negative result does not preclude SARS-Cov-2 infection and should not be used as the sole basis for treatment or other patient management decisions. A negative result may occur with  improper specimen collection/handling, submission of specimen other than nasopharyngeal swab, presence of viral mutation(s) within the areas targeted by this assay, and inadequate number of viral copies(<138 copies/mL). A negative result must be combined with clinical observations, patient history, and epidemiological information. The expected result is Negative.  Fact Sheet for Patients:  BloggerCourse.com  Fact Sheet for Healthcare Providers:  SeriousBroker.it  This test is no t yet approved or cleared by the [place] FDA and  has been authorized for detection and/or diagnosis of SARS-CoV-2 by FDA under an Emergency Use Authorization (EUA). This EUA will remain  in effect (meaning this test can be used) for the duration of the COVID-19 declaration under Section 564(b)(1) of the Act, 21 U.S.C.section 360bbb-3(b)(1), unless the authorization is terminated  or revoked sooner.       Influenza A by PCR NEGATIVE NEGATIVE Final   Influenza B by PCR NEGATIVE NEGATIVE Final    Comment: (NOTE) The Xpert Xpress  SARS-CoV-2/FLU/RSV plus assay is intended as an aid in the diagnosis of influenza from Nasopharyngeal swab specimens and should not be used as a sole basis for treatment. Nasal washings and aspirates are unacceptable for Xpert  Xpress SARS-CoV-2/FLU/RSV testing.  Fact Sheet for Patients: BloggerCourse.com  Fact Sheet for Healthcare Providers: SeriousBroker.it  This test is not yet approved or cleared by the [place] FDA and has been authorized for detection and/or diagnosis of SARS-CoV-2 by FDA under an Emergency Use Authorization (EUA). This EUA will remain in effect (meaning this test can be used) for the duration of the COVID-19 declaration under Section 564(b)(1) of the Act, 21 U.S.C. section 360bbb-3(b)(1), unless the authorization is terminated or revoked.  Performed at [facility], [address] [place], [place] [address]   Culture, blood (Routine x 2)     Status: Abnormal (Preliminary result)   Collection Time: 07/17/21  8:55 PM   Specimen: BLOOD  Result Value Ref Range Status   Specimen Description   Final    BLOOD BLOOD LEFT WRIST Performed at [facility], [address], [place], [place] [address]    Special Requests   Final    BOTTLES DRAWN AEROBIC AND ANAEROBIC Blood Culture adequate volume Performed at [facility], [address], [place], [place] [address]    Culture  Setup Time   Final    GRAM POSITIVE COCCI IN BOTH AEROBIC AND ANAEROBIC BOTTLES Gram Stain Report Called to,Read Back By and Verified With: DOSS,M AT 1424 ON 10.17.22 BY RUCINSKI,B CRITICAL RESULT CALLED TO, READ BACK BY AND VERIFIED WITH: [facility] WAX RN 07/18/2021  BY JW    Culture (A)  Final    STAPHYLOCOCCUS HOMINIS STAPHYLOCOCCUS EPIDERMIDIS THE SIGNIFICANCE OF ISOLATING THIS ORGANISM FROM A SINGLE SET OF BLOOD CULTURES WHEN MULTIPLE SETS ARE DRAWN IS UNCERTAIN. PLEASE NOTIFY THE MICROBIOLOGY DEPARTMENT WITHIN ONE WEEK IF SPECIATION  AND SENSITIVITIES ARE REQUIRED. Performed at [facility], [address] [place], [place] [address]    Report Status PENDING  Incomplete  Blood Culture ID Panel (Reflexed)     Status: Abnormal   Collection Time: 07/17/21  8:55 PM  Result Value Ref Range Status   Enterococcus faecalis NOT DETECTED NOT DETECTED Final   Enterococcus Faecium NOT DETECTED NOT DETECTED Final   Listeria monocytogenes NOT DETECTED NOT DETECTED Final   Staphylococcus species DETECTED (A) NOT DETECTED Final    Comment: CRITICAL RESULT CALLED TO, READ BACK BY AND VERIFIED WITH: MANDY WAX RN 07/18/2021  BY JW    Staphylococcus aureus (BCID) NOT DETECTED NOT DETECTED Final   Staphylococcus epidermidis DETECTED (A) NOT DETECTED Final    Comment: CRITICAL RESULT CALLED TO, READ BACK BY AND VERIFIED WITH: MANDY WAX RN 07/18/2021  BY JW    Staphylococcus lugdunensis NOT DETECTED NOT DETECTED Final   Streptococcus species NOT DETECTED NOT DETECTED Final   Streptococcus agalactiae NOT DETECTED NOT DETECTED Final   Streptococcus pneumoniae NOT DETECTED NOT DETECTED Final   Streptococcus pyogenes NOT DETECTED NOT DETECTED Final   A.calcoaceticus-baumannii NOT DETECTED NOT DETECTED Final   Bacteroides fragilis NOT DETECTED NOT DETECTED Final   Enterobacterales NOT DETECTED NOT DETECTED Final   Enterobacter cloacae complex NOT DETECTED NOT DETECTED Final   Escherichia coli NOT DETECTED NOT DETECTED Final   Klebsiella aerogenes NOT DETECTED NOT DETECTED Final   Klebsiella oxytoca NOT DETECTED NOT DETECTED Final   Klebsiella pneumoniae NOT DETECTED NOT DETECTED Final   Proteus species NOT DETECTED NOT DETECTED Final   Salmonella species NOT DETECTED NOT DETECTED Final   Serratia marcescens NOT DETECTED NOT DETECTED Final   Haemophilus influenzae NOT DETECTED NOT DETECTED Final   Neisseria meningitidis NOT DETECTED NOT DETECTED Final   Pseudomonas  aeruginosa NOT DETECTED NOT DETECTED Final    Stenotrophomonas maltophilia NOT DETECTED NOT DETECTED Final   Candida albicans NOT DETECTED NOT DETECTED Final   Candida auris NOT DETECTED NOT DETECTED Final   Candida glabrata NOT DETECTED NOT DETECTED Final   Candida krusei NOT DETECTED NOT DETECTED Final   Candida parapsilosis NOT DETECTED NOT DETECTED Final   Candida tropicalis NOT DETECTED NOT DETECTED Final   Cryptococcus neoformans/gattii NOT DETECTED NOT DETECTED Final   Methicillin resistance mecA/C NOT DETECTED NOT DETECTED Final    Comment: Performed at [facility], [address], [place], [place] [address]  Urine Culture     Status: Abnormal   Collection Time: 07/17/21 11:00 PM   Specimen: Urine, Catheterized  Result Value Ref Range Status   Specimen Description   Final    URINE, CATHETERIZED Performed at [facility], [address] [place], [place] [address]    Special Requests   Final    NONE Performed at [facility], [address], [place], [place] [address]    Culture >=100,000 COLONIES/mL PROTEUS MIRABILIS (A)  Final   Report Status 07/20/2021 FINAL  Final   Organism ID, Bacteria PROTEUS MIRABILIS (A)  Final      Susceptibility   Proteus mirabilis - MIC*    AMPICILLIN <=2 SENSITIVE Sensitive     CEFAZOLIN <=4 SENSITIVE Sensitive     CEFEPIME <=0.12 SENSITIVE Sensitive     CEFTRIAXONE <=0.25 SENSITIVE Sensitive     CIPROFLOXACIN <=0.25 SENSITIVE Sensitive     GENTAMICIN <=1 SENSITIVE Sensitive     IMIPENEM 2 SENSITIVE Sensitive     NITROFURANTOIN 128 RESISTANT Resistant     TRIMETH/SULFA <=20 SENSITIVE Sensitive     AMPICILLIN/SULBACTAM <=2 SENSITIVE Sensitive     PIP/TAZO <=4 SENSITIVE Sensitive     * >=100,000 COLONIES/mL PROTEUS MIRABILIS  Respiratory (~20 pathogens) panel by PCR     Status: None   Collection Time: 07/18/21 11:00 AM   Specimen: Nasopharyngeal Swab; Respiratory  Result Value Ref Range Status   Adenovirus NOT DETECTED NOT DETECTED Final   Coronavirus 229E NOT DETECTED NOT  DETECTED Final    Comment: (NOTE) The Coronavirus on the Respiratory Panel, DOES NOT test for the novel  Coronavirus (2019 nCoV)    Coronavirus HKU1 NOT DETECTED NOT DETECTED Final   Coronavirus NL63 NOT DETECTED NOT DETECTED Final   Coronavirus OC43 NOT DETECTED NOT DETECTED Final   Metapneumovirus NOT DETECTED NOT DETECTED Final   Rhinovirus / Enterovirus NOT DETECTED NOT DETECTED Final   Influenza A NOT DETECTED NOT DETECTED Final   Influenza B NOT DETECTED NOT DETECTED Final   Parainfluenza Virus 1 NOT DETECTED NOT DETECTED Final   Parainfluenza Virus 2 NOT DETECTED NOT DETECTED Final   Parainfluenza Virus 3 NOT DETECTED NOT DETECTED Final   Parainfluenza Virus 4 NOT DETECTED NOT DETECTED Final   Respiratory Syncytial Virus NOT DETECTED NOT DETECTED Final   Bordetella pertussis NOT DETECTED NOT DETECTED Final   Bordetella Parapertussis NOT DETECTED NOT DETECTED Final   Chlamydophila pneumoniae NOT DETECTED NOT DETECTED Final   Mycoplasma pneumoniae NOT DETECTED NOT DETECTED Final    Comment: Performed at [facility], [address], [place], [place] [address]  MRSA Next Gen by PCR, Nasal     Status: None   Collection Time: 07/18/21 11:00 AM   Specimen: Nasal Mucosa; Nasal Swab  Result Value Ref Range Status   MRSA by PCR Next Gen NOT DETECTED NOT DETECTED Final  Comment: (NOTE) The GeneXpert MRSA Assay (FDA approved for NASAL specimens only), is one component of a comprehensive MRSA colonization surveillance program. It is not intended to diagnose MRSA infection nor to guide or monitor treatment for MRSA infections. Test performance is not FDA approved in patients less than 3 years old. Performed at [facility], [address] [place], [place] [address]   Culture, blood (routine x 2)     Status: None (Preliminary result)   Collection Time: 07/20/21  6:36 AM   Specimen: BLOOD LEFT HAND  Result Value Ref Range Status   Specimen Description BLOOD LEFT HAND  Final    Special Requests   Final    Blood Culture adequate volume BOTTLES DRAWN AEROBIC AND ANAEROBIC   Culture   Final    NO GROWTH <12 HOURS Performed at [facility], [address] [place], [place] [address]    Report Status PENDING  Incomplete  Culture, blood (routine x 2)     Status: None (Preliminary result)   Collection Time: 07/20/21  6:41 AM   Specimen: BLOOD LEFT HAND  Result Value Ref Range Status   Specimen Description BLOOD LEFT HAND  Final   Special Requests   Final    Blood Culture adequate volume BOTTLES DRAWN AEROBIC AND ANAEROBIC   Culture   Final    NO GROWTH <12 HOURS Performed at [facility], [address], [place], [place] [address]    Report Status PENDING  Incomplete      Radiology Studies: No results found.    Scheduled Meds:  atorvastatin  40 mg Oral Daily   clopidogrel  75 mg Oral Q breakfast   diclofenac Sodium  2 g Topical TID   ferrous sulfate  325 mg Oral Q breakfast   fluticasone furoate-vilanterol  1 puff Inhalation Daily   vitamin B-12  500 mcg Oral Daily   Continuous Infusions:  sodium chloride Stopped (07/19/21 1310)   ceFEPime (MAXIPIME) IV 2 g (07/20/21 0810)     LOS: 3 days      Time spent: 25 minutes   Noralee Stain, DO [facility] 07/20/2021, 1:33 PM   Available via Epic secure chat 7am-7pm After these hours, please refer to coverage provider listed on [email or web address]

## 2021-07-20 NOTE — Plan of Care (Addendum)
  Problem: Acute Rehab PT Goals(only PT should resolve) Goal: Pt will Roll Supine to Side Outcome: Progressing Flowsheets (Taken 07/20/2021 1616) Pt will Roll Supine to Side:  with min assist  with mod assist Goal: Pt Will Go Supine/Side To Sit Outcome: Progressing Flowsheets (Taken 07/20/2021 1616) Pt will go Supine/Side to Sit:  with minimal assist  with moderate assist Goal: Pt Will Go Sit To Supine/Side Outcome: Progressing Flowsheets (Taken 07/20/2021 1616) Pt will go Sit to Supine/Side:  with minimal assist  with moderate assist Goal: Patient Will Perform Sitting Balance Outcome: Progressing Flowsheets (Taken 07/20/2021 1616) Patient will perform sitting balance:  with no UE support  with minimal assist  with min guard assist Goal: Patient Will Transfer Sit To/From Stand Outcome: Progressing Flowsheets (Taken 07/20/2021 1616) Patient will transfer sit to/from stand: with moderate assist Goal: Pt Will Transfer Bed To Chair/Chair To Bed Outcome: Progressing Flowsheets (Taken 07/20/2021 1616) Pt will Transfer Bed to Chair/Chair to Bed: with mod assist Goal: Pt Will Perform Standing Balance Or Pre-Gait Outcome: Progressing Flowsheets (Taken 07/20/2021 1616) Pt will perform standing balance or pre-gait: with moderate assist Goal: Pt Will Ambulate Outcome: Progressing Flowsheets (Taken 07/20/2021 1616) Pt will Ambulate:  10 feet  with rolling walker  with moderate assist  with maximum assist  Cassie Jones, SPT   During this treatment session, the therapist was present, participating in and directing the treatment.  4:27 PM, 07/20/21 Ocie Bob, MPT Physical Therapist with [facility] 336 [phone] office [phone] mobile phone

## 2021-07-20 NOTE — TOC Initial Note (Signed)
Transition of Care [facility]) - Initial/Assessment Note    Patient Details  Name: Debra Pratt MRN: [id number] Date of Birth: 1925/02/19  Transition [facility] Care [facility]) CM/SW Contact:    Iona Beard, [place] Phone Number: 07/20/2021, 3:34 PM  Clinical Narrative:                 Pt is resident at [facility]. CSW met with [facility] staff in pts room. They state pt has been needing more assistance than normal for the last 4 months. They feel as if SNF would benefit pt. PT assessed pt and are recommending SNF. CSW spoke with Seib,Lois pts DIL who states that she things SNF would be good for pt. CSW to send out referral for pt and present bed offers to pts DIL. TOC to follow.   Expected Discharge Plan: Skilled Nursing Facility Barriers to Discharge: Continued Medical Work up   Patient Goals and CMS Choice Patient states their goals for this hospitalization and ongoing recovery are:: Go to SNF CMS Medicare.gov Compare Post Acute Care list provided to:: Patient Represenative (must comment) Choice offered to / list presented to : [place] / Guardian  Expected Discharge Plan and Services Expected Discharge Plan: [place] In-house Referral: Clinical Social Work Discharge Planning Services: CM Consult Post Acute Care Choice: [place] arrangements for the past 2 months: [place]                                      Prior Living Arrangements/Services Living arrangements for the past 2 months: [place] Lives with:: Facility Resident Patient language and need for interpreter reviewed:: Yes Do you feel safe going back to the place where you live?: Yes      Need for Family Participation in Patient Care: Yes (Comment) Care giver support system in place?: Yes (comment)   Criminal Activity/Legal Involvement Pertinent to Current Situation/Hospitalization: No - Comment as needed  Activities of Daily Living Home Assistive  Devices/Equipment: Cane (specify quad or straight) ADL Screening (condition at time of admission) Patient's cognitive ability adequate to safely complete daily activities?: No Is the patient deaf or have difficulty hearing?: Yes Does the patient have difficulty seeing, even when wearing glasses/contacts?: No Does the patient have difficulty concentrating, remembering, or making decisions?: Yes Patient able to express need for assistance with ADLs?: Yes Does the patient have difficulty dressing or bathing?: Yes Independently performs ADLs?: No Communication: Independent Dressing (OT): Needs assistance Is this a change from baseline?: Pre-admission baseline Grooming: Needs assistance Is this a change from baseline?: Pre-admission baseline Feeding: Needs assistance Is this a change from baseline?: Pre-admission baseline Bathing: Needs assistance Is this a change from baseline?: Pre-admission baseline Toileting: Needs assistance Is this a change from baseline?: Pre-admission baseline In/Out Bed: Needs assistance Is this a change from baseline?: Pre-admission baseline Walks in Home: Needs assistance Is this a change from baseline?: Pre-admission baseline Does the patient have difficulty walking or climbing stairs?: Yes Weakness of Legs: Both Weakness of Arms/Hands: None  Permission Sought/Granted                  Emotional Assessment Appearance:: Appears stated age Attitude/Demeanor/Rapport: Unable to Assess Affect (typically observed): Unable to Assess Orientation: : Oriented to Self Alcohol / Substance Use: Not Applicable Psych Involvement: No (comment)  Admission diagnosis:  Sepsis ([place]) [A41.9] Sepsis without acute organ dysfunction, due to  unspecified organism [facility]) [A41.9] Patient Active Problem List   Diagnosis Date Noted   Essential hypertension 07/18/2021   Acute respiratory failure with hypoxia ([place]) 07/18/2021   Moderate protein-calorie malnutrition ([place])  07/18/2021   Sepsis ([place]) 07/17/2021   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

## 2021-07-20 NOTE — NC FL2 (Addendum)
[place] MEDICAID FL2 LEVEL OF CARE SCREENING TOOL     IDENTIFICATION  Patient Name: Debra Pratt Birthdate: [birth year] Sex: female Admission Date (Current Location): 07/17/2021  [facility] and [place] Number:  [employer] and Address:  [facility],  [address], [place] [address]      Provider Number: [id number]  Attending Physician Name and Address:  Noralee Stain, DO  Relative Name and Phone Number:  [facility] (Legal Guardian)   [phone]    Current Level of Care: Hospital Recommended Level of Care: Skilled Nursing Facility Prior Approval Number:    Date Approved/Denied:   PASRR Number:  [id number]  Discharge Plan: SNF    Current Diagnoses: Patient Active Problem List   Diagnosis Date Noted   Essential hypertension 07/18/2021   Acute respiratory failure with hypoxia (H[facility]) 07/18/2021   Moderate protein-calorie malnutrition (HCC) 07/18/2021   Sepsis (H[facility]) 07/17/2021   Stroke (cerebrum) (HCC) 05/18/2021   Dementia without behavioral disturbance (HCC) 05/18/2021   Frequent falls 05/18/2021   Forehead laceration 05/18/2021   Fall 05/18/2021   Asthma 05/18/2021   Hard of hearing 05/18/2021    Orientation RESPIRATION BLADDER Height & Weight     Self  O2 (1L) Incontinent, External catheter Weight: 186 lb 4.6 oz (84.5 kg) Height:  5\' 7"  (170.2 cm)  BEHAVIORAL SYMPTOMS/MOOD NEUROLOGICAL BOWEL NUTRITION STATUS      Incontinent Diet (See D/C summary)  AMBULATORY STATUS COMMUNICATION OF NEEDS Skin   Extensive Assist Verbally Normal                       Personal Care Assistance Level of Assistance  Bathing, Feeding, Dressing, Total care Bathing Assistance: Maximum assistance Feeding assistance: Limited assistance Dressing Assistance: Maximum assistance Total Care Assistance: Maximum assistance   Functional Limitations Info  Sight, Hearing, Speech Sight Info: Adequate Hearing Info: Impaired Speech Info: Adequate     SPECIAL CARE FACTORS FREQUENCY  PT (By licensed PT), OT (By licensed OT)     PT Frequency: 5 times weekly OT Frequency: 5 times weekly            Contractures Contractures Info: Not present    Additional Factors Info  Code Status, Allergies Code Status Info: DNR Allergies Info: NKA           Current Medications (07/20/2021):  This is the current hospital active medication list Current Facility-Administered Medications  Medication Dose Route Frequency Provider Last Rate Last Admin   0.9 %  sodium chloride infusion   Intravenous PRN 07/09/2021, MD   Stopped at 07/19/21 1310   acetaminophen (TYLENOL) tablet 650 mg  650 mg Oral Q6H PRN Zierle-Ghosh, Asia B, DO   650 mg at 07/19/21 1846   Or   acetaminophen (TYLENOL) suppository 650 mg  650 mg Rectal Q6H PRN Zierle-Ghosh, Asia B, DO       albuterol (PROVENTIL) (2.5 MG/3ML) 0.083% nebulizer solution 2.5 mg  2.5 mg Nebulization Q6H PRN 07/21/21, MD       atorvastatin (LIPITOR) tablet 40 mg  40 mg Oral Daily Zierle-Ghosh, Asia B, DO   40 mg at 07/20/21 0805   ceFEPIme (MAXIPIME) 2 g in sodium chloride 0.9 % 100 mL IVPB  2 g Intravenous Q12H Zierle-Ghosh, Asia B, DO 200 mL/hr at 07/20/21 0810 2 g at 07/20/21 0810   clopidogrel (PLAVIX) tablet 75 mg  75 mg Oral Q breakfast Zierle-Ghosh, Asia B, DO   75 mg at 07/20/21 [phone]  ferrous sulfate tablet 325 mg  325 mg Oral Q breakfast Briant Cedar, MD   325 mg at 07/20/21 0805   fluticasone furoate-vilanterol (BREO ELLIPTA) 100-25 MCG/INH 1 puff  1 puff Inhalation Daily Zierle-Ghosh, Asia B, DO   1 puff at 07/20/21 1014   ondansetron (ZOFRAN) tablet 4 mg  4 mg Oral Q6H PRN Zierle-Ghosh, Asia B, DO       Or   ondansetron (ZOFRAN) injection 4 mg  4 mg Intravenous Q6H PRN Zierle-Ghosh, Asia B, DO       oxyCODONE (Oxy IR/ROXICODONE) immediate release tablet 5 mg  5 mg Oral Q4H PRN Zierle-Ghosh, Asia B, DO       traZODone (DESYREL) tablet 50 mg  50 mg Oral QHS PRN  Zierle-Ghosh, Asia B, DO       vitamin B-12 (CYANOCOBALAMIN) tablet 500 mcg  500 mcg Oral Daily Zierle-Ghosh, Asia B, DO   500 mcg at 07/20/21 0805     Discharge Medications: Please see discharge summary for a list of discharge medications.  Relevant Imaging Results:  Relevant Lab Results:   Additional Information SSN: [id number]  [place], [place]

## 2021-07-21 ENCOUNTER — Inpatient Hospital Stay (HOSPITAL_COMMUNITY): Payer: Medicare Other

## 2021-07-21 LAB — CBC WITH DIFFERENTIAL/PLATELET
Abs Immature Granulocytes: 0.13 10*3/uL — ABNORMAL HIGH (ref 0.00–0.07)
Basophils Absolute: 0.1 10*3/uL (ref 0.0–0.1)
Basophils Relative: 1 %
Eosinophils Absolute: 0.3 10*3/uL (ref 0.0–0.5)
Eosinophils Relative: 2 %
HCT: 27 % — ABNORMAL LOW (ref 36.0–46.0)
Hemoglobin: 8.2 g/dL — ABNORMAL LOW (ref 12.0–15.0)
Immature Granulocytes: 1 %
Lymphocytes Relative: 28 %
Lymphs Abs: 3.3 10*3/uL (ref 0.7–4.0)
MCH: 27.8 pg (ref 26.0–34.0)
MCHC: 30.4 g/dL (ref 30.0–36.0)
MCV: 91.5 fL (ref 80.0–100.0)
Monocytes Absolute: 1.2 10*3/uL — ABNORMAL HIGH (ref 0.1–1.0)
Monocytes Relative: 10 %
Neutro Abs: 7.1 10*3/uL (ref 1.7–7.7)
Neutrophils Relative %: 58 %
Platelets: 375 10*3/uL (ref 150–400)
RBC: 2.95 MIL/uL — ABNORMAL LOW (ref 3.87–5.11)
RDW: 14 % (ref 11.5–15.5)
WBC: 12 10*3/uL — ABNORMAL HIGH (ref 4.0–10.5)
nRBC: 0 % (ref 0.0–0.2)

## 2021-07-21 LAB — BASIC METABOLIC PANEL
Anion gap: 5 (ref 5–15)
BUN: 23 mg/dL (ref 8–23)
CO2: 23 mmol/L (ref 22–32)
Calcium: 8.1 mg/dL — ABNORMAL LOW (ref 8.9–10.3)
Chloride: 110 mmol/L (ref 98–111)
Creatinine, Ser: 0.91 mg/dL (ref 0.44–1.00)
GFR, Estimated: 58 mL/min — ABNORMAL LOW (ref >60)
Glucose, Bld: 107 mg/dL — ABNORMAL HIGH (ref 70–99)
Potassium: 4 mmol/L (ref 3.5–5.1)
Sodium: 138 mmol/L (ref 135–145)

## 2021-07-21 LAB — CULTURE, BLOOD (ROUTINE X 2): Special Requests: ADEQUATE

## 2021-07-21 IMAGING — DX DG CHEST 1V PORT
1 series · 1 of 1 positions shown · non-contrast
Comparison: [DATE]

CLINICAL DATA: Fever

EXAM:
PORTABLE CHEST 1 VIEW

[chest ap]
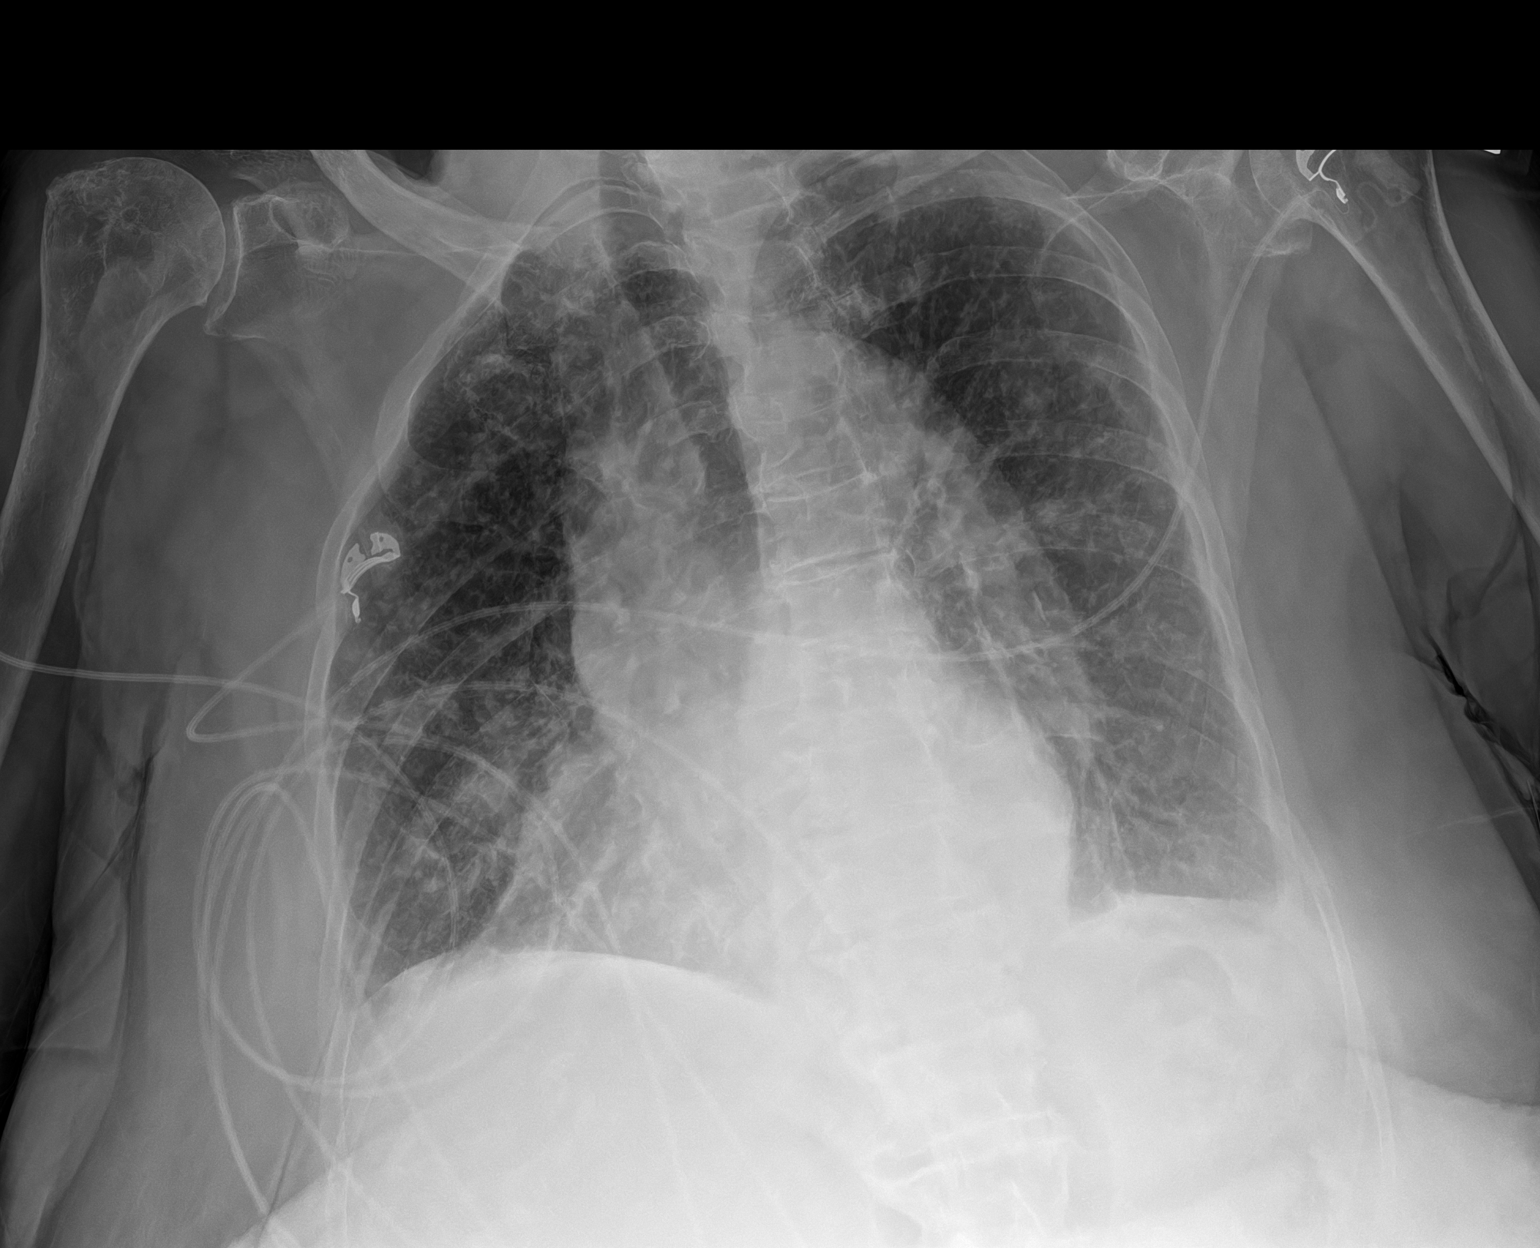

[1 of 1 positions shown; findings below may reference images not displayed]

FINDINGS: Patient is rotated. Grossly stable cardiomediastinal contours.
Aortic atherosclerosis. Diffusely prominent interstitial markings.
Increasing patchy opacity in the right lower lobe. No large pleural
fluid collection. No pneumothorax.
IMPRESSION: Increasing patchy opacity in the right lower lobe concerning for
pneumonia.

## 2021-07-21 MED ORDER — CEPHALEXIN 500 MG PO CAPS
500.0000 mg | ORAL_CAPSULE | Freq: Four times a day (QID) | ORAL | Status: DC
  Administered 2021-07-21 – 2021-07-22 (×4): 500 mg via ORAL
  Filled 2021-07-21 (×4): qty 1

## 2021-07-21 MED ORDER — SENNOSIDES-DOCUSATE SODIUM 8.6-50 MG PO TABS
1.0000 | ORAL_TABLET | Freq: Every day | ORAL | Status: DC
  Administered 2021-07-21: 1 via ORAL
  Filled 2021-07-21: qty 1

## 2021-07-21 MED ORDER — METOPROLOL TARTRATE 5 MG/5ML IV SOLN
5.0000 mg | Freq: Four times a day (QID) | INTRAVENOUS | Status: DC
  Administered 2021-07-21: 5 mg via INTRAVENOUS
  Filled 2021-07-21: qty 5

## 2021-07-21 MED ORDER — METOPROLOL SUCCINATE ER 25 MG PO TB24
12.5000 mg | ORAL_TABLET | Freq: Every day | ORAL | Status: DC
  Administered 2021-07-21: 12.5 mg via ORAL
  Filled 2021-07-21: qty 1

## 2021-07-21 MED ORDER — METOPROLOL SUCCINATE ER 25 MG PO TB24: 12.5000 mg | ORAL_TABLET | Freq: Every day | ORAL | Status: DC

## 2021-07-21 MED ORDER — METOPROLOL TARTRATE 5 MG/5ML IV SOLN: 2.5000 mg | Freq: Four times a day (QID) | INTRAVENOUS | Status: DC | PRN

## 2021-07-21 MED ORDER — POLYETHYLENE GLYCOL 3350 17 G PO PACK
17.0000 g | PACK | Freq: Every day | ORAL | Status: DC
  Administered 2021-07-21 – 2021-07-22 (×2): 17 g via ORAL
  Filled 2021-07-21 (×2): qty 1

## 2021-07-21 MED ORDER — APIXABAN 5 MG PO TABS
5.0000 mg | ORAL_TABLET | Freq: Two times a day (BID) | ORAL | Status: DC
  Administered 2021-07-21 – 2021-07-22 (×2): 5 mg via ORAL
  Filled 2021-07-21 (×2): qty 1

## 2021-07-21 NOTE — Progress Notes (Signed)
Pt was called into room by NT due to pt taking IV out herself. Iv tip intact not bleeding noted. Noralee Stain, DO informed of removal of IV and doesn't recommended inserting another at this time.

## 2021-07-21 NOTE — Plan of Care (Signed)
°  Problem: Nutrition: °Goal: Adequate nutrition will be maintained °Outcome: Progressing °  °Problem: Coping: °Goal: Level of anxiety will decrease °Outcome: Progressing °  °Problem: Safety: °Goal: Ability to remain free from injury will improve °Outcome: Progressing °  °

## 2021-07-21 NOTE — Progress Notes (Signed)
PROGRESS NOTE    Debra Pratt  YEB:[id number] DOB: September 09, 1925 DOA: 07/17/2021 PCP: Patient, No Pcp Per (Inactive)     Brief Narrative:  Debra Pratt  is a 87 y.o. female, with history of dementia presents the ED with a chief complaint of fever. History is limited due to patient's dementia. Pt resides at [facility], reported that she had a temperature of 100 and had an episode of vomiting, cough, wheezing, loss of appetite and generalized weakness. Of note, pt is very HOH. No further history could be obtained. In the ED, temp 101.3, respiratory rate as high as 32, heart rate as high as 131, blood pressure as low as 97/65, satting at 96% on 2 L nasal cannula, leukocytosis 15.9, hemoglobin 8.8 down from about 12 in 8/22. Flu and covid negative. UA positive for UTI, culture pending. Chest x-ray shows mild interstitial opacity without focal airspace disease that could indicate viral infection. Patient admitted for further management.  New events last 24 hours / Subjective: Patient sitting in bed, eating breakfast this morning.  She is very hard of hearing, poor historian overall, voices no new complaints, does not appear to be in any distress.  She is noted to be in A. fib RVR with rate 130s.  Had pulled out her IV earlier this morning.  Asked RN to replace IV now and give IV metoprolol.  EKG and chest x-ray obtained.  Afterward, was notified by RN that patient returned to A. fib with rate in the 80s.  Assessment & Plan:   Active Problems:   Sepsis (HCC)   Essential hypertension   Acute respiratory failure with hypoxia (HCC)   Moderate protein-calorie malnutrition (HCC)   Sepsis secondary to UTI + CAP -Sepsis present on admission admitted with tachycardia 131, tachypnea 32, leukocytosis 15.9 -Urine culture >100,000 Proteus mirabilis -Blood culture 1 set growing staph epidermidis, likely a contaminant, repeat blood cultures pending -Chest x-ray revealed mild interstitial opacity -Repeat  chest x-ray revealed increasing patchy opacity in the right lower lobe concerning for pneumonia. Get SLP eval to rule out aspiration  -COVID, flu, respiratory viral panel negative -Continue Keflex  A. fib RVR -Remained asymptomatic, returned to rate controlled A. fib with IV metoprolol -Start p.o. metoprolol for rate control -Switch Plavix to Eliquis in setting of previous history of CVA with new A. Fib -Had recent echocardiogram in August 2022 which revealed grade 1 diastolic dysfunction  Hypertension -Resume home amlodipine  Normocytic anemia -Drop in hemoglobin 12 --> 8.8 since August.  Patient was recently started on Plavix.  FOBT is pending -Hemoglobin stable last 3 days  History of CVA -Continue Plavix, Lipitor  Dementia -Supportive care  CKD stage IIIa -Stable  Chronic diastolic heart failure -Echocardiogram 05/19/2021 revealed EF 60 to 65% with grade 1 diastolic dysfunction  DVT prophylaxis: Eliquis  SCDs Start: 07/18/21 0049  Code Status: DNR  Family Communication: No family at bedside, updated legal guardian over the phone today Disposition Plan:  Status is: Inpatient  Remains inpatient appropriate because: A. fib RVR this morning.  Hopefully to SNF tomorrow if remains stable      Consultants:  None  Procedures:  None  Antimicrobials:  Anti-infectives (From admission, onward)    Start     Dose/Rate Route Frequency Ordered Stop   07/21/21 0900  cephALEXin (KEFLEX) capsule 500 mg        500 mg Oral Every 6 hours 07/21/21 0752     07/18/21 2230  vancomycin (VANCOREADY) IVPB 1250 mg/250 mL  Status:  Discontinued       See Hyperspace for full Linked Orders Report.   1,250 mg 166.7 mL/hr over 90 Minutes Intravenous Every 24 hours 07/17/21 2134 07/18/21 1250   07/17/21 2230  vancomycin (VANCOREADY) IVPB 1750 mg/350 mL       See Hyperspace for full Linked Orders Report.   1,750 mg 175 mL/hr over 120 Minutes Intravenous  Once 07/17/21 2134 07/18/21 0622    07/17/21 2200  ceFEPIme (MAXIPIME) 2 g in sodium chloride 0.9 % 100 mL IVPB  Status:  Discontinued        2 g 200 mL/hr over 30 Minutes Intravenous Every 12 hours 07/17/21 2134 07/21/21 0752        Objective: Vitals:   07/21/21 0948 07/21/21 1008 07/21/21 1013 07/21/21 1104  BP: 116/70 109/60  125/60  Pulse: (!) 130 (!) 103  96  Resp: (!) 28 (!) 28  (!) 24  Temp: 98.6 F (37 C)   98.7 F (37.1 C)  TempSrc: Oral   Oral  SpO2: 93%  94% 96%  Weight:      Height:        Intake/Output Summary (Last 24 hours) at 07/21/2021 1240 Last data filed at 07/21/2021 1010 Gross per 24 hour  Intake 480 ml  Output 1050 ml  Net -570 ml    Filed Weights   07/17/21 2026 07/18/21 1737  Weight: 90.7 kg 84.5 kg    Examination: General exam: Appears calm and comfortable, hard of hearing Respiratory system: Clear to auscultation anteriorly. Respiratory effort normal.  On room air Cardiovascular system: S1 & S2 heard, irregular rhythm rate 130 Gastrointestinal system: Abdomen is nondistended, soft and nontender. Normal bowel sounds heard. Central nervous system: Alert  Extremities: Symmetric in appearance bilaterally  Skin: No rashes, lesions or ulcers on exposed skin  Psychiatry: Judgement and insight appear poor   Data Reviewed: I have personally reviewed following labs and imaging studies  CBC: Recent Labs  Lab 07/17/21 2055 07/18/21 0417 07/19/21 0647 07/20/21 0641 07/21/21 0602  WBC 15.9* 14.6* 13.1* 12.3* 12.0*  NEUTROABS 12.6* 9.9* 8.8* 7.9* 7.1  HGB 8.8* 8.2* 8.0* 8.3* 8.2*  HCT 28.4* 27.6* 26.9* 27.6* 27.0*  MCV 93.1 93.6 93.7 92.3 91.5  PLT 301 295 299 335 375    Basic Metabolic Panel: Recent Labs  Lab 07/17/21 2055 07/18/21 0417 07/19/21 0647 07/20/21 0641 07/21/21 0602  NA 137 141 139 138 138  K 4.0 4.0 3.9 3.7 4.0  CL 106 111 111 109 110  CO2 22 26 20* 23 23  GLUCOSE 121* 107* 89 102* 107*  BUN 34* 29* 26* 26* 23  CREATININE 1.11* 1.03* 0.91 0.91  0.91  CALCIUM 8.3* 8.0* 8.1* 8.2* 8.1*  MG  --  2.2  --   --   --     GFR: Estimated Creatinine Clearance: 42.3 mL/min (by C-G formula based on SCr of 0.91 mg/dL). Liver Function Tests: Recent Labs  Lab 07/17/21 2055 07/18/21 0417  AST 21 18  ALT 17 17  ALKPHOS 64 60  BILITOT 0.5 0.5  PROT 7.3 6.6  ALBUMIN 2.9* 2.6*    No results for input(s): LIPASE, AMYLASE in the last 168 hours. No results for input(s): AMMONIA in the last 168 hours. Coagulation Profile: Recent Labs  Lab 07/17/21 2055 07/18/21 0417  INR 1.1 1.2    Cardiac Enzymes: No results for input(s): CKTOTAL, CKMB, CKMBINDEX, TROPONINI in the last 168 hours. BNP (last 3 results) No results for input(s):  PROBNP in the last 8760 hours. HbA1C: No results for input(s): HGBA1C in the last 72 hours. CBG: No results for input(s): GLUCAP in the last 168 hours. Lipid Profile: No results for input(s): CHOL, HDL, LDLCALC, TRIG, CHOLHDL, LDLDIRECT in the last 72 hours. Thyroid Function Tests: No results for input(s): TSH, T4TOTAL, FREET4, T3FREE, THYROIDAB in the last 72 hours. Anemia Panel: Recent Labs    07/19/21 0647  VITAMINB12 1,200*  FOLATE 15.3  FERRITIN 100  TIBC 289  IRON 11*    Sepsis Labs: Recent Labs  Lab 07/17/21 2055 07/17/21 2358 07/18/21 0417  PROCALCITON  --   --  0.15  LATICACIDVEN 1.0 0.7  --      Recent Results (from the past 240 hour(s))  Culture, blood (Routine x 2)     Status: None (Preliminary result)   Collection Time: 07/17/21  8:12 PM   Specimen: BLOOD  Result Value Ref Range Status   Specimen Description BLOOD BLOOD LEFT FOREARM  Final   Special Requests   Final    BOTTLES DRAWN AEROBIC AND ANAEROBIC Blood Culture adequate volume   Culture   Final    NO GROWTH 4 DAYS Performed at [facility], [address], [place], [place] [address]    Report Status PENDING  Incomplete  Resp Panel by RT-PCR (Flu A&B, Covid) Nasopharyngeal Swab     Status: None   Collection Time:  07/17/21  8:48 PM   Specimen: Nasopharyngeal Swab; Nasopharyngeal(NP) swabs in vial transport medium  Result Value Ref Range Status   SARS Coronavirus 2 by RT PCR NEGATIVE NEGATIVE Final    Comment: (NOTE) SARS-CoV-2 target nucleic acids are NOT DETECTED.  The SARS-CoV-2 RNA is generally detectable in upper respiratory specimens during the acute phase of infection. The lowest concentration of SARS-CoV-2 viral copies this assay can detect is 138 copies/mL. A negative result does not preclude SARS-Cov-2 infection and should not be used as the sole basis for treatment or other patient management decisions. A negative result may occur with  improper specimen collection/handling, submission of specimen other than nasopharyngeal swab, presence of viral mutation(s) within the areas targeted by this assay, and inadequate number of viral copies(<138 copies/mL). A negative result must be combined with clinical observations, patient history, and epidemiological information. The expected result is Negative.  Fact Sheet for Patients:  BloggerCourse.com  Fact Sheet for Healthcare Providers:  SeriousBroker.it  This test is no t yet approved or cleared by the [place] FDA and  has been authorized for detection and/or diagnosis of SARS-CoV-2 by FDA under an Emergency Use Authorization (EUA). This EUA will remain  in effect (meaning this test can be used) for the duration of the COVID-19 declaration under Section 564(b)(1) of the Act, 21 U.S.C.section 360bbb-3(b)(1), unless the authorization is terminated  or revoked sooner.       Influenza A by PCR NEGATIVE NEGATIVE Final   Influenza B by PCR NEGATIVE NEGATIVE Final    Comment: (NOTE) The Xpert Xpress SARS-CoV-2/FLU/RSV plus assay is intended as an aid in the diagnosis of influenza from Nasopharyngeal swab specimens and should not be used as a sole basis for treatment. Nasal washings  and aspirates are unacceptable for Xpert Xpress SARS-CoV-2/FLU/RSV testing.  Fact Sheet for Patients: BloggerCourse.com  Fact Sheet for Healthcare Providers: SeriousBroker.it  This test is not yet approved or cleared by the [place] FDA and has been authorized for detection and/or diagnosis of SARS-CoV-2 by FDA under an Emergency Use Authorization (EUA). This EUA will  remain in effect (meaning this test can be used) for the duration of the COVID-19 declaration under Section 564(b)(1) of the Act, 21 U.S.C. section 360bbb-3(b)(1), unless the authorization is terminated or revoked.  Performed at [facility], [address], [place], [place] [address]   Culture, blood (Routine x 2)     Status: Abnormal   Collection Time: 07/17/21  8:55 PM   Specimen: BLOOD  Result Value Ref Range Status   Specimen Description   Final    BLOOD BLOOD LEFT WRIST Performed at [facility], [address], [place], [place] [address]    Special Requests   Final    BOTTLES DRAWN AEROBIC AND ANAEROBIC Blood Culture adequate volume Performed at [facility], [address] [place], [place] [address]    Culture  Setup Time   Final    GRAM POSITIVE COCCI IN BOTH AEROBIC AND ANAEROBIC BOTTLES Gram Stain Report Called to,Read Back By and Verified With: DOSS,M AT 1424 ON 10.17.22 BY RUCINSKI,B CRITICAL RESULT CALLED TO, READ BACK BY AND VERIFIED WITH: MANDY WAX RN 07/18/2021  BY JW    Culture (A)  Final    STAPHYLOCOCCUS HOMINIS STAPHYLOCOCCUS EPIDERMIDIS THE SIGNIFICANCE OF ISOLATING THIS ORGANISM FROM A SINGLE SET OF BLOOD CULTURES WHEN MULTIPLE SETS ARE DRAWN IS UNCERTAIN. PLEASE NOTIFY THE MICROBIOLOGY DEPARTMENT WITHIN ONE WEEK IF SPECIATION AND SENSITIVITIES ARE REQUIRED. Performed at [facility], [address], [place], [place] [address]    Report Status 07/21/2021 FINAL  Final  Blood Culture ID Panel (Reflexed)     Status: Abnormal    Collection Time: 07/17/21  8:55 PM  Result Value Ref Range Status   Enterococcus faecalis NOT DETECTED NOT DETECTED Final   Enterococcus Faecium NOT DETECTED NOT DETECTED Final   Listeria monocytogenes NOT DETECTED NOT DETECTED Final   Staphylococcus species DETECTED (A) NOT DETECTED Final    Comment: CRITICAL RESULT CALLED TO, READ BACK BY AND VERIFIED WITH: MANDY WAX RN 07/18/2021  BY JW    Staphylococcus aureus (BCID) NOT DETECTED NOT DETECTED Final   Staphylococcus epidermidis DETECTED (A) NOT DETECTED Final    Comment: CRITICAL RESULT CALLED TO, READ BACK BY AND VERIFIED WITH: MANDY WAX RN 07/18/2021  BY JW    Staphylococcus lugdunensis NOT DETECTED NOT DETECTED Final   Streptococcus species NOT DETECTED NOT DETECTED Final   Streptococcus agalactiae NOT DETECTED NOT DETECTED Final   Streptococcus pneumoniae NOT DETECTED NOT DETECTED Final   Streptococcus pyogenes NOT DETECTED NOT DETECTED Final   A.calcoaceticus-baumannii NOT DETECTED NOT DETECTED Final   Bacteroides fragilis NOT DETECTED NOT DETECTED Final   Enterobacterales NOT DETECTED NOT DETECTED Final   Enterobacter cloacae complex NOT DETECTED NOT DETECTED Final   Escherichia coli NOT DETECTED NOT DETECTED Final   Klebsiella aerogenes NOT DETECTED NOT DETECTED Final   Klebsiella oxytoca NOT DETECTED NOT DETECTED Final   Klebsiella pneumoniae NOT DETECTED NOT DETECTED Final   Proteus species NOT DETECTED NOT DETECTED Final   Salmonella species NOT DETECTED NOT DETECTED Final   Serratia marcescens NOT DETECTED NOT DETECTED Final   Haemophilus influenzae NOT DETECTED NOT DETECTED Final   Neisseria meningitidis NOT DETECTED NOT DETECTED Final   Pseudomonas aeruginosa NOT DETECTED NOT DETECTED Final   Stenotrophomonas maltophilia NOT DETECTED NOT DETECTED Final   Candida albicans NOT DETECTED NOT DETECTED Final   Candida auris NOT DETECTED NOT DETECTED Final   Candida glabrata NOT DETECTED NOT DETECTED Final    Candida krusei NOT DETECTED NOT DETECTED Final  Candida parapsilosis NOT DETECTED NOT DETECTED Final   Candida tropicalis NOT DETECTED NOT DETECTED Final   Cryptococcus neoformans/gattii NOT DETECTED NOT DETECTED Final   Methicillin resistance mecA/C NOT DETECTED NOT DETECTED Final    Comment: Performed at [facility], [address] [place], [place] [address]  Urine Culture     Status: Abnormal   Collection Time: 07/17/21 11:00 PM   Specimen: Urine, Catheterized  Result Value Ref Range Status   Specimen Description   Final    URINE, CATHETERIZED Performed at [facility], [address], [place], [place] [address]    Special Requests   Final    NONE Performed at [facility], [address], [place], [place] [address]    Culture >=100,000 COLONIES/mL PROTEUS MIRABILIS (A)  Final   Report Status 07/20/2021 FINAL  Final   Organism ID, Bacteria PROTEUS MIRABILIS (A)  Final      Susceptibility   Proteus mirabilis - MIC*    AMPICILLIN <=2 SENSITIVE Sensitive     CEFAZOLIN <=4 SENSITIVE Sensitive     CEFEPIME <=0.12 SENSITIVE Sensitive     CEFTRIAXONE <=0.25 SENSITIVE Sensitive     CIPROFLOXACIN <=0.25 SENSITIVE Sensitive     GENTAMICIN <=1 SENSITIVE Sensitive     IMIPENEM 2 SENSITIVE Sensitive     NITROFURANTOIN 128 RESISTANT Resistant     TRIMETH/SULFA <=20 SENSITIVE Sensitive     AMPICILLIN/SULBACTAM <=2 SENSITIVE Sensitive     PIP/TAZO <=4 SENSITIVE Sensitive     * >=100,000 COLONIES/mL PROTEUS MIRABILIS  Respiratory (~20 pathogens) panel by PCR     Status: None   Collection Time: 07/18/21 11:00 AM   Specimen: Nasopharyngeal Swab; Respiratory  Result Value Ref Range Status   Adenovirus NOT DETECTED NOT DETECTED Final   Coronavirus 229E NOT DETECTED NOT DETECTED Final    Comment: (NOTE) The Coronavirus on the Respiratory Panel, DOES NOT test for the novel  Coronavirus (2019 nCoV)    Coronavirus HKU1 NOT DETECTED NOT DETECTED Final   Coronavirus NL63 NOT DETECTED NOT  DETECTED Final   Coronavirus OC43 NOT DETECTED NOT DETECTED Final   Metapneumovirus NOT DETECTED NOT DETECTED Final   Rhinovirus / Enterovirus NOT DETECTED NOT DETECTED Final   Influenza A NOT DETECTED NOT DETECTED Final   Influenza B NOT DETECTED NOT DETECTED Final   Parainfluenza Virus 1 NOT DETECTED NOT DETECTED Final   Parainfluenza Virus 2 NOT DETECTED NOT DETECTED Final   Parainfluenza Virus 3 NOT DETECTED NOT DETECTED Final   Parainfluenza Virus 4 NOT DETECTED NOT DETECTED Final   Respiratory Syncytial Virus NOT DETECTED NOT DETECTED Final   Bordetella pertussis NOT DETECTED NOT DETECTED Final   Bordetella Parapertussis NOT DETECTED NOT DETECTED Final   Chlamydophila pneumoniae NOT DETECTED NOT DETECTED Final   Mycoplasma pneumoniae NOT DETECTED NOT DETECTED Final    Comment: Performed at [facility], [address] [place], [place] [address]  MRSA Next Gen by PCR, Nasal     Status: None   Collection Time: 07/18/21 11:00 AM   Specimen: Nasal Mucosa; Nasal Swab  Result Value Ref Range Status   MRSA by PCR Next Gen NOT DETECTED NOT DETECTED Final    Comment: (NOTE) The GeneXpert MRSA Assay (FDA approved for NASAL specimens only), is one component of a comprehensive MRSA colonization surveillance program. It is not intended to diagnose MRSA infection nor to guide or monitor treatment for MRSA infections. Test performance is not FDA approved in patients less than 24 years old.  Performed at [facility], [address] [place], [place] [address]   Culture, blood (routine x 2)     Status: None (Preliminary result)   Collection Time: 07/20/21  6:36 AM   Specimen: BLOOD LEFT HAND  Result Value Ref Range Status   Specimen Description BLOOD LEFT HAND  Final   Special Requests   Final    Blood Culture adequate volume BOTTLES DRAWN AEROBIC AND ANAEROBIC   Culture   Final    NO GROWTH 1 DAY Performed at [facility], [address] [place], [place] [address]    Report Status  PENDING  Incomplete  Culture, blood (routine x 2)     Status: None (Preliminary result)   Collection Time: 07/20/21  6:41 AM   Specimen: BLOOD RIGHT HAND  Result Value Ref Range Status   Specimen Description BLOOD RIGHT HAND  Final   Special Requests   Final    Blood Culture adequate volume BOTTLES DRAWN AEROBIC AND ANAEROBIC   Culture   Final    NO GROWTH 1 DAY Performed at [facility], [address] [place], [place] [address]    Report Status PENDING  Incomplete       Radiology Studies: DG CHEST PORT 1 VIEW  Result Date: 07/21/2021 CLINICAL DATA:  Fever EXAM: PORTABLE CHEST 1 VIEW COMPARISON:  07/17/2021 FINDINGS: Patient is rotated. Grossly stable cardiomediastinal contours. Aortic atherosclerosis. Diffusely prominent interstitial markings. Increasing patchy opacity in the right lower lobe. No large pleural fluid collection. No pneumothorax. IMPRESSION: Increasing patchy opacity in the right lower lobe concerning for pneumonia. Electronically Signed   By: Duanne Guess D.O.   On: 07/21/2021 11:12      Scheduled Meds:  atorvastatin  40 mg Oral Daily   cephALEXin  500 mg Oral Q6H   clopidogrel  75 mg Oral Q breakfast   ferrous sulfate  325 mg Oral Q breakfast   fluticasone furoate-vilanterol  1 puff Inhalation Daily   metoprolol tartrate  5 mg Intravenous Q6H   polyethylene glycol  17 g Oral Daily   senna-docusate  1 tablet Oral QHS   vitamin B-12  500 mcg Oral Daily   Continuous Infusions:  sodium chloride Stopped (07/19/21 1310)     LOS: 4 days      Time spent: 40 minutes   Noralee Stain, DO [facility] 07/21/2021, 12:40 PM   Available via Epic secure chat 7am-7pm After these hours, please refer to coverage provider listed on [email or web address]

## 2021-07-21 NOTE — Progress Notes (Signed)
Hold Tele monitor for tonight per O. Adefeso, MD. Patient has removed monitor multiple times this shift.

## 2021-07-21 NOTE — Progress Notes (Signed)
**Note De-identified  Obfuscation** EKG complete and placed in patient chart 

## 2021-07-21 NOTE — TOC Progression Note (Signed)
Transition of Care [facility]) - Progression Note    Patient Details  Name: Jacqualynn Parco MRN: [id number] Date of Birth: 1925/09/08  [facility] CM/SW Contact  Villa Herb, [place] Phone Number: 07/21/2021, 11:00 AM  Clinical Narrative:    CSW spoke to [facility] with [facility] who states they have a rehab bed available for pt and it can be ready late this afternoon. CSW spoke with [place] pts DIL who states that she would like to accept bed at [facility]. CSW spoke to [place] to inquire about pts COVID vaccination status. [facility] faxed copies of vaccine card to [place] and CSW. CSW spoke with Attending who states pt may be ready tmrw. CSW update Kerri with [facility] who states they will be able to accept pt trmw. Pt will not need a COVID test before D/C. Kerri with [facility] states she will speak with pts DIL to work on their paperwork. TOC to follow.   Expected Discharge Plan: Skilled Nursing Facility Barriers to Discharge: Continued Medical Work up  Expected Discharge Plan and Services Expected Discharge Plan: Skilled Nursing Facility In-house Referral: Clinical Social Work Discharge Planning Services: CM Consult Post Acute Care Choice: Skilled Nursing Facility Living arrangements for the past 2 months: Assisted Living Facility                                       Social Determinants of Health (SDOH) Interventions    Readmission Risk Interventions Readmission Risk Prevention Plan 07/20/2021  Transportation Screening Complete  Home Care Screening Complete  Medication Review (RN CM) Complete

## 2021-07-22 ENCOUNTER — Inpatient Hospital Stay
Admission: RE | Admit: 2021-07-22 | Discharge: 2021-10-02 | Disposition: E | Payer: Medicare Other | Source: Ambulatory Visit | Attending: Internal Medicine | Admitting: Internal Medicine

## 2021-07-22 DIAGNOSIS — I4891 Unspecified atrial fibrillation: Secondary | ICD-10-CM

## 2021-07-22 LAB — CBC WITH DIFFERENTIAL/PLATELET
Abs Immature Granulocytes: 0.13 10*3/uL — ABNORMAL HIGH (ref 0.00–0.07)
Basophils Absolute: 0.1 10*3/uL (ref 0.0–0.1)
Basophils Relative: 1 %
Eosinophils Absolute: 0.4 10*3/uL (ref 0.0–0.5)
Eosinophils Relative: 3 %
HCT: 27 % — ABNORMAL LOW (ref 36.0–46.0)
Hemoglobin: 8.3 g/dL — ABNORMAL LOW (ref 12.0–15.0)
Immature Granulocytes: 1 %
Lymphocytes Relative: 26 %
Lymphs Abs: 3.1 10*3/uL (ref 0.7–4.0)
MCH: 28.1 pg (ref 26.0–34.0)
MCHC: 30.7 g/dL (ref 30.0–36.0)
MCV: 91.5 fL (ref 80.0–100.0)
Monocytes Absolute: 1.2 10*3/uL — ABNORMAL HIGH (ref 0.1–1.0)
Monocytes Relative: 10 %
Neutro Abs: 7 10*3/uL (ref 1.7–7.7)
Neutrophils Relative %: 59 %
Platelets: 375 10*3/uL (ref 150–400)
RBC: 2.95 MIL/uL — ABNORMAL LOW (ref 3.87–5.11)
RDW: 14.2 % (ref 11.5–15.5)
WBC: 11.8 10*3/uL — ABNORMAL HIGH (ref 4.0–10.5)
nRBC: 0 % (ref 0.0–0.2)

## 2021-07-22 LAB — BASIC METABOLIC PANEL
Anion gap: 6 (ref 5–15)
BUN: 26 mg/dL — ABNORMAL HIGH (ref 8–23)
CO2: 25 mmol/L (ref 22–32)
Calcium: 8.3 mg/dL — ABNORMAL LOW (ref 8.9–10.3)
Chloride: 106 mmol/L (ref 98–111)
Creatinine, Ser: 0.94 mg/dL (ref 0.44–1.00)
GFR, Estimated: 56 mL/min — ABNORMAL LOW (ref >60)
Glucose, Bld: 98 mg/dL (ref 70–99)
Potassium: 4.3 mmol/L (ref 3.5–5.1)
Sodium: 137 mmol/L (ref 135–145)

## 2021-07-22 LAB — CULTURE, BLOOD (ROUTINE X 2)
Culture: NO GROWTH
Special Requests: ADEQUATE

## 2021-07-22 MED ORDER — CEPHALEXIN 500 MG PO CAPS: 500.0000 mg | ORAL_CAPSULE | Freq: Four times a day (QID) | ORAL | 0 refills | Status: AC

## 2021-07-22 MED ORDER — ATORVASTATIN CALCIUM 80 MG PO TABS: 80.0000 mg | ORAL_TABLET | Freq: Every day | ORAL | 2 refills | Status: DC

## 2021-07-22 MED ORDER — METOPROLOL SUCCINATE ER 25 MG PO TB24: 25.0000 mg | ORAL_TABLET | Freq: Every day | ORAL | 2 refills | Status: DC

## 2021-07-22 MED ORDER — APIXABAN 5 MG PO TABS
5.0000 mg | ORAL_TABLET | Freq: Two times a day (BID) | ORAL | 2 refills | Status: DC
Start: 2021-07-22 — End: 2021-09-06

## 2021-07-22 MED ORDER — METOPROLOL SUCCINATE ER 25 MG PO TB24
25.0000 mg | ORAL_TABLET | Freq: Every day | ORAL | Status: DC
  Administered 2021-07-22: 25 mg via ORAL
  Filled 2021-07-22: qty 1

## 2021-07-22 NOTE — TOC Transition Note (Signed)
Transition of Care [facility]) - CM/SW Discharge Note   Patient Details  Name: Debra Pratt MRN: [id number] Date of Birth: 07-Jan-1925  [facility] CM/SW Contact:  Villa Herb, LCSWA Phone Number: 07/21/2021, 12:50 PM   Clinical Narrative:    CSW updated on pts readiness for D/C. CSW spoke to Debra Pratt with [facility] who states they are ready for pt. CSW provided pts RN with number for report and that Debra Pratt is who to speak with. Once report has been called pt can transition to facility. CSW updated pts DIL Debra Pratt of pts d/c to [facility]. She is understanding and agreeable. TOC signing off.  Final next level of care: Skilled Nursing Facility Barriers to Discharge: Barriers Resolved   Patient Goals and CMS Choice Patient states their goals for this hospitalization and ongoing recovery are:: Go to SNF CMS Medicare.gov Compare Post Acute Care list provided to:: Patient Represenative (must comment) Choice offered to / list presented to : Debra Pratt  Discharge Placement              Patient chooses bed at: [facility]   Name of family member notified: Debra Pratt Patient and family notified of of transfer: 07/04/2021  Discharge Plan and Services In-house Referral: Clinical Social Work Discharge Planning Services: CM Consult Post Acute Care Choice: Skilled Nursing Facility                               Social Determinants of Health (SDOH) Interventions     Readmission Risk Interventions Readmission Risk Prevention Plan 07/20/2021  Transportation Screening Complete  Home Care Screening Complete  Medication Review (RN CM) Complete

## 2021-07-22 NOTE — Discharge Summary (Addendum)
Physician Discharge Summary  Debra Pratt ZOX:[id number] DOB: 02-24-1925 DOA: 07/17/2021  PCP: Patient, No Pcp Per (Inactive)  Admit date: 07/17/2021 Discharge date: 07/06/2021  Admitted From: ALF Disposition:  SNF   Recommendations for Outpatient Follow-up:  Follow up with PCP in 1 week Recommend outpatient palliative care services to follow patient as recommended from previous hospitalization in August Recommend continue PT/OT/SLP evaluation   Discharge Condition: Stable CODE STATUS: DNR  Diet recommendation:  Diet Orders (From admission, onward)     Start     Ordered   07/18/21 0049  Diet Heart Room service appropriate? Yes; Fluid consistency: Thin  Diet effective now       Question Answer Comment  Room service appropriate? Yes   Fluid consistency: Thin      07/18/21 0048           Brief/Interim Summary: Debra Pratt  is a 87 y.o. female, with history of dementia presents the ED with a chief complaint of fever. History is limited due to patient's dementia. Pt resides at ALF [facility], reported that she had a temperature of 100 and had an episode of vomiting, cough, wheezing, loss of appetite and generalized weakness. Of note, pt is very HOH. No further history could be obtained. In the ED, temp 101.3, respiratory rate as high as 32, heart rate as high as 131, blood pressure as low as 97/65, satting at 96% on 2 L nasal cannula, leukocytosis 15.9, hemoglobin 8.8 down from about 12 in 8/22. Flu and covid negative. UA positive for UTI, culture pending. Chest x-ray shows mild interstitial opacity without focal airspace disease that could indicate viral infection. Patient admitted for further management.  Patient was treated for sepsis secondary to UTI and.  Urine culture was positive for Proteus.  Blood culture 1 set showed staph epidermidis which was likely a contaminant.  Repeat blood cultures were negative.  Patient's antibiotic was de-escalated to Keflex.  During hospitalization,  patient went into A. fib RVR which was well controlled on beta-blocker.  Patient's Plavix was switched to Eliquis in setting of A. fib and recent history of CVA.  Patient's hemoglobin remained stable during hospitalization in range of 8.  There was no evidence of any overt GI bleeding during her hospital stay.  Patient was evaluated by PT who recommended SNF placement.  Discharge Diagnoses:  Principal Problem:   Sepsis (HCC) Active Problems:   Dementia without behavioral disturbance (HCC)   Essential hypertension   Acute respiratory failure with hypoxia (HCC)   Moderate protein-calorie malnutrition (HCC)   Atrial fibrillation with RVR (HCC)  Sepsis secondary to UTI + CAP -Sepsis present on admission admitted with tachycardia 131, tachypnea 32, leukocytosis 15.9 -Urine culture >100,000 Proteus mirabilis -Blood culture 1 set growing staph epidermidis, likely a contaminant, repeat blood cultures negative -Chest x-ray revealed mild interstitial opacity -Repeat chest x-ray revealed increasing patchy opacity in the right lower lobe concerning for pneumonia -COVID, flu, respiratory viral panel negative -Continue Keflex   A. fib RVR -CHA2DS2-VASc Score = 8  The patient's score is based upon: CHF History: 1 HTN History: 1 Diabetes History: 0 Stroke History: 2 Vascular Disease History: 1 Age Score: 2 Gender Score: 1  -Remained asymptomatic, returned to rate controlled A. fib with IV metoprolol -Start p.o. metoprolol for rate control -Switch Plavix to Eliquis -Had recent echocardiogram in August 2022 which revealed grade 1 diastolic dysfunction  Hypertension -Continue metoprolol  Normocytic anemia -Drop in hemoglobin 12 --> 8.8 since August.  Patient was recently started  on Plavix.  FOBT is pending -Hemoglobin stable last several days   History of CVA -Continue Eliquis, Lipitor  Dementia -Supportive care   CKD stage IIIa -Stable   Chronic diastolic heart  failure -Echocardiogram 05/19/2021 revealed EF 60 to 65% with grade 1 diastolic dysfunction  Discharge Instructions  Discharge Instructions     Increase activity slowly   Complete by: As directed       Allergies as of 2021-08-11   No Known Allergies      Medication List     STOP taking these medications    amLODipine 2.5 MG tablet Commonly known as: NORVASC   aspirin EC 81 MG tablet   clopidogrel 75 MG tablet Commonly known as: PLAVIX       TAKE these medications    acetaminophen 500 MG tablet Commonly known as: TYLENOL Take 500 mg by mouth every 6 (six) hours as needed for mild pain, fever or headache.   albuterol 108 (90 Base) MCG/ACT inhaler Commonly known as: VENTOLIN HFA Inhale 1-2 puffs into the lungs every 6 (six) hours as needed for wheezing or shortness of breath.   apixaban 5 MG Tabs tablet Commonly known as: ELIQUIS Take 1 tablet (5 mg total) by mouth 2 (two) times daily.   atorvastatin 80 MG tablet Commonly known as: Lipitor Take 1 tablet (80 mg total) by mouth daily. What changed:  medication strength how much to take   CALCIUM 500 PO Take 1 tablet by mouth daily.   cephALEXin 500 MG capsule Commonly known as: KEFLEX Take 1 capsule (500 mg total) by mouth every 6 (six) hours for 3 days.   D3 5000 125 MCG (5000 UT) capsule Generic drug: Cholecalciferol Take 5,000 Units by mouth daily.   metoprolol succinate 25 MG 24 hr tablet Commonly known as: TOPROL-XL Take 1 tablet (25 mg total) by mouth daily. Start taking on: July 23, 2021   Symbicort 80-4.5 MCG/ACT inhaler Generic drug: budesonide-formoterol Inhale 2 puffs into the lungs 2 (two) times daily.   vitamin B-12 500 MCG tablet Commonly known as: CYANOCOBALAMIN Take 500 mcg by mouth daily.        No Known Allergies  Consultations: None    Procedures/Studies: DG CHEST PORT 1 VIEW  Result Date: 07/21/2021 CLINICAL DATA:  Fever EXAM: PORTABLE CHEST 1 VIEW  COMPARISON:  07/17/2021 FINDINGS: Patient is rotated. Grossly stable cardiomediastinal contours. Aortic atherosclerosis. Diffusely prominent interstitial markings. Increasing patchy opacity in the right lower lobe. No large pleural fluid collection. No pneumothorax. IMPRESSION: Increasing patchy opacity in the right lower lobe concerning for pneumonia. Electronically Signed   By: Duanne Guess D.O.   On: 07/21/2021 11:12   DG Chest Portable 1 View  Result Date: 07/17/2021 CLINICAL DATA:  Sepsis EXAM: PORTABLE CHEST 1 VIEW COMPARISON:  05/18/2021 FINDINGS: Diffuse mild interstitial opacity. Cardiomediastinal contours are normal. No pleural effusion or pneumothorax. No focal airspace consolidation. IMPRESSION: Diffuse mild interstitial opacity without focal airspace disease. This could indicate viral infection. Electronically Signed   By: Deatra Robinson M.D.   On: 07/17/2021 21:25       Discharge Exam: Vitals:   2021-08-11 0812 08-11-21 0904  BP:  (!) 126/57  Pulse:  86  Resp:    Temp:    SpO2: 93%     General: Pt is alert, awake, not in acute distress Cardiovascular: Irreg rhythm rate 80s, S1/S2 +, no edema Respiratory: CTA bilaterally, no wheezing, no rhonchi, no respiratory distress, no conversational dyspnea, on room air  Abdominal:  Soft, NT, ND, bowel sounds + Extremities: no edema, no cyanosis Psych: Normal mood and affect, poor  judgement and insight due to dementia     The results of significant diagnostics from this hospitalization (including imaging, microbiology, ancillary and laboratory) are listed below for reference.     Microbiology: Recent Results (from the past 240 hour(s))  Culture, blood (Routine x 2)     Status: None   Collection Time: 07/17/21  8:12 PM   Specimen: BLOOD  Result Value Ref Range Status   Specimen Description BLOOD BLOOD LEFT FOREARM  Final   Special Requests   Final    BOTTLES DRAWN AEROBIC AND ANAEROBIC Blood Culture adequate volume   Culture    Final    NO GROWTH 5 DAYS Performed at [facility], [address], [place], [place] [address]    Report Status 07/21/2021 FINAL  Final  Resp Panel by RT-PCR (Flu A&B, Covid) Nasopharyngeal Swab     Status: None   Collection Time: 07/17/21  8:48 PM   Specimen: Nasopharyngeal Swab; Nasopharyngeal(NP) swabs in vial transport medium  Result Value Ref Range Status   SARS Coronavirus 2 by RT PCR NEGATIVE NEGATIVE Final    Comment: (NOTE) SARS-CoV-2 target nucleic acids are NOT DETECTED.  The SARS-CoV-2 RNA is generally detectable in upper respiratory specimens during the acute phase of infection. The lowest concentration of SARS-CoV-2 viral copies this assay can detect is 138 copies/mL. A negative result does not preclude SARS-Cov-2 infection and should not be used as the sole basis for treatment or other patient management decisions. A negative result may occur with  improper specimen collection/handling, submission of specimen other than nasopharyngeal swab, presence of viral mutation(s) within the areas targeted by this assay, and inadequate number of viral copies(<138 copies/mL). A negative result must be combined with clinical observations, patient history, and epidemiological information. The expected result is Negative.  Fact Sheet for Patients:  BloggerCourse.com  Fact Sheet for Healthcare Providers:  SeriousBroker.it  This test is no t yet approved or cleared by the [place] FDA and  has been authorized for detection and/or diagnosis of SARS-CoV-2 by FDA under an Emergency Use Authorization (EUA). This EUA will remain  in effect (meaning this test can be used) for the duration of the COVID-19 declaration under Section 564(b)(1) of the Act, 21 U.S.C.section 360bbb-3(b)(1), unless the authorization is terminated  or revoked sooner.       Influenza A by PCR NEGATIVE NEGATIVE Final   Influenza B by PCR NEGATIVE  NEGATIVE Final    Comment: (NOTE) The Xpert Xpress SARS-CoV-2/FLU/RSV plus assay is intended as an aid in the diagnosis of influenza from Nasopharyngeal swab specimens and should not be used as a sole basis for treatment. Nasal washings and aspirates are unacceptable for Xpert Xpress SARS-CoV-2/FLU/RSV testing.  Fact Sheet for Patients: BloggerCourse.com  Fact Sheet for Healthcare Providers: SeriousBroker.it  This test is not yet approved or cleared by the [place] FDA and has been authorized for detection and/or diagnosis of SARS-CoV-2 by FDA under an Emergency Use Authorization (EUA). This EUA will remain in effect (meaning this test can be used) for the duration of the COVID-19 declaration under Section 564(b)(1) of the Act, 21 U.S.C. section 360bbb-3(b)(1), unless the authorization is terminated or revoked.  Performed at [facility], [address] [place], [place] [address]   Culture, blood (Routine x 2)     Status: Abnormal   Collection Time: 07/17/21  8:55 PM   Specimen: BLOOD  Result Value Ref Range Status   Specimen Description   Final    BLOOD BLOOD LEFT WRIST Performed at [facility], [address], [place], [place] [address]    Special Requests   Final    BOTTLES DRAWN AEROBIC AND ANAEROBIC Blood Culture adequate volume Performed at [facility], [address] [place], [place] [address]    Culture  Setup Time   Final    GRAM POSITIVE COCCI IN BOTH AEROBIC AND ANAEROBIC BOTTLES Gram Stain Report Called to,Read Back By and Verified With: DOSS,M AT 1424 ON 10.17.22 BY RUCINSKI,B CRITICAL RESULT CALLED TO, READ BACK BY AND VERIFIED WITH: [facility] WAX RN 07/18/2021  BY JW    Culture (A)  Final    STAPHYLOCOCCUS HOMINIS STAPHYLOCOCCUS EPIDERMIDIS THE SIGNIFICANCE OF ISOLATING THIS ORGANISM FROM A SINGLE SET OF BLOOD CULTURES WHEN MULTIPLE SETS ARE DRAWN IS UNCERTAIN. PLEASE NOTIFY THE MICROBIOLOGY DEPARTMENT  WITHIN ONE WEEK IF SPECIATION AND SENSITIVITIES ARE REQUIRED. Performed at [facility], [address], [place], [place] [address]    Report Status 07/21/2021 FINAL  Final  Blood Culture ID Panel (Reflexed)     Status: Abnormal   Collection Time: 07/17/21  8:55 PM  Result Value Ref Range Status   Enterococcus faecalis NOT DETECTED NOT DETECTED Final   Enterococcus Faecium NOT DETECTED NOT DETECTED Final   Listeria monocytogenes NOT DETECTED NOT DETECTED Final   Staphylococcus species DETECTED (A) NOT DETECTED Final    Comment: CRITICAL RESULT CALLED TO, READ BACK BY AND VERIFIED WITH: MANDY WAX RN 07/18/2021  BY JW    Staphylococcus aureus (BCID) NOT DETECTED NOT DETECTED Final   Staphylococcus epidermidis DETECTED (A) NOT DETECTED Final    Comment: CRITICAL RESULT CALLED TO, READ BACK BY AND VERIFIED WITH: MANDY WAX RN 07/18/2021  BY JW    Staphylococcus lugdunensis NOT DETECTED NOT DETECTED Final   Streptococcus species NOT DETECTED NOT DETECTED Final   Streptococcus agalactiae NOT DETECTED NOT DETECTED Final   Streptococcus pneumoniae NOT DETECTED NOT DETECTED Final   Streptococcus pyogenes NOT DETECTED NOT DETECTED Final   A.calcoaceticus-baumannii NOT DETECTED NOT DETECTED Final   Bacteroides fragilis NOT DETECTED NOT DETECTED Final   Enterobacterales NOT DETECTED NOT DETECTED Final   Enterobacter cloacae complex NOT DETECTED NOT DETECTED Final   Escherichia coli NOT DETECTED NOT DETECTED Final   Klebsiella aerogenes NOT DETECTED NOT DETECTED Final   Klebsiella oxytoca NOT DETECTED NOT DETECTED Final   Klebsiella pneumoniae NOT DETECTED NOT DETECTED Final   Proteus species NOT DETECTED NOT DETECTED Final   Salmonella species NOT DETECTED NOT DETECTED Final   Serratia marcescens NOT DETECTED NOT DETECTED Final   Haemophilus influenzae NOT DETECTED NOT DETECTED Final   Neisseria meningitidis NOT DETECTED NOT DETECTED Final   Pseudomonas aeruginosa NOT DETECTED  NOT DETECTED Final   Stenotrophomonas maltophilia NOT DETECTED NOT DETECTED Final   Candida albicans NOT DETECTED NOT DETECTED Final   Candida auris NOT DETECTED NOT DETECTED Final   Candida glabrata NOT DETECTED NOT DETECTED Final   Candida krusei NOT DETECTED NOT DETECTED Final   Candida parapsilosis NOT DETECTED NOT DETECTED Final   Candida tropicalis NOT DETECTED NOT DETECTED Final   Cryptococcus neoformans/gattii NOT DETECTED NOT DETECTED Final   Methicillin resistance mecA/C NOT DETECTED NOT DETECTED Final    Comment: Performed at [facility], [address], [place], [place] [address]  Urine Culture     Status: Abnormal   Collection Time: 07/17/21 11:00 PM  Specimen: Urine, Catheterized  Result Value Ref Range Status   Specimen Description   Final    URINE, CATHETERIZED Performed at [facility], [address] [place], [place] [address]    Special Requests   Final    NONE Performed at [facility], [address], [place], [place] [address]    Culture >=100,000 COLONIES/mL PROTEUS MIRABILIS (A)  Final   Report Status 07/20/2021 FINAL  Final   Organism ID, Bacteria PROTEUS MIRABILIS (A)  Final      Susceptibility   Proteus mirabilis - MIC*    AMPICILLIN <=2 SENSITIVE Sensitive     CEFAZOLIN <=4 SENSITIVE Sensitive     CEFEPIME <=0.12 SENSITIVE Sensitive     CEFTRIAXONE <=0.25 SENSITIVE Sensitive     CIPROFLOXACIN <=0.25 SENSITIVE Sensitive     GENTAMICIN <=1 SENSITIVE Sensitive     IMIPENEM 2 SENSITIVE Sensitive     NITROFURANTOIN 128 RESISTANT Resistant     TRIMETH/SULFA <=20 SENSITIVE Sensitive     AMPICILLIN/SULBACTAM <=2 SENSITIVE Sensitive     PIP/TAZO <=4 SENSITIVE Sensitive     * >=100,000 COLONIES/mL PROTEUS MIRABILIS  Respiratory (~20 pathogens) panel by PCR     Status: None   Collection Time: 07/18/21 11:00 AM   Specimen: Nasopharyngeal Swab; Respiratory  Result Value Ref Range Status   Adenovirus NOT DETECTED NOT DETECTED Final   Coronavirus 229E  NOT DETECTED NOT DETECTED Final    Comment: (NOTE) The Coronavirus on the Respiratory Panel, DOES NOT test for the novel  Coronavirus (2019 nCoV)    Coronavirus HKU1 NOT DETECTED NOT DETECTED Final   Coronavirus NL63 NOT DETECTED NOT DETECTED Final   Coronavirus OC43 NOT DETECTED NOT DETECTED Final   Metapneumovirus NOT DETECTED NOT DETECTED Final   Rhinovirus / Enterovirus NOT DETECTED NOT DETECTED Final   Influenza A NOT DETECTED NOT DETECTED Final   Influenza B NOT DETECTED NOT DETECTED Final   Parainfluenza Virus 1 NOT DETECTED NOT DETECTED Final   Parainfluenza Virus 2 NOT DETECTED NOT DETECTED Final   Parainfluenza Virus 3 NOT DETECTED NOT DETECTED Final   Parainfluenza Virus 4 NOT DETECTED NOT DETECTED Final   Respiratory Syncytial Virus NOT DETECTED NOT DETECTED Final   Bordetella pertussis NOT DETECTED NOT DETECTED Final   Bordetella Parapertussis NOT DETECTED NOT DETECTED Final   Chlamydophila pneumoniae NOT DETECTED NOT DETECTED Final   Mycoplasma pneumoniae NOT DETECTED NOT DETECTED Final    Comment: Performed at [facility], [address], [place], [place] [address]  MRSA Next Gen by PCR, Nasal     Status: None   Collection Time: 07/18/21 11:00 AM   Specimen: Nasal Mucosa; Nasal Swab  Result Value Ref Range Status   MRSA by PCR Next Gen NOT DETECTED NOT DETECTED Final    Comment: (NOTE) The GeneXpert MRSA Assay (FDA approved for NASAL specimens only), is one component of a comprehensive MRSA colonization surveillance program. It is not intended to diagnose MRSA infection nor to guide or monitor treatment for MRSA infections. Test performance is not FDA approved in patients less than 42 years old. Performed at [facility], [address], [place], [place] [address]   Culture, blood (routine x 2)     Status: None (Preliminary result)   Collection Time: 07/20/21  6:36 AM   Specimen: BLOOD LEFT HAND  Result Value Ref Range Status   Specimen Description BLOOD  LEFT HAND  Final   Special Requests   Final    Blood Culture adequate volume  BOTTLES DRAWN AEROBIC AND ANAEROBIC   Culture   Final    NO GROWTH 2 DAYS Performed at [facility], [address] [place], [place] [address]    Report Status PENDING  Incomplete  Culture, blood (routine x 2)     Status: None (Preliminary result)   Collection Time: 07/20/21  6:41 AM   Specimen: BLOOD RIGHT HAND  Result Value Ref Range Status   Specimen Description BLOOD RIGHT HAND  Final   Special Requests   Final    Blood Culture adequate volume BOTTLES DRAWN AEROBIC AND ANAEROBIC   Culture   Final    NO GROWTH 2 DAYS Performed at [facility], [address] [place], [place] [address]    Report Status PENDING  Incomplete     Labs: BNP (last 3 results) No results for input(s): BNP in the last 8760 hours. Basic Metabolic Panel: Recent Labs  Lab 07/18/21 0417 07/19/21 0647 07/20/21 0641 07/21/21 0602 07/21/2021 0543  NA 141 139 138 138 137  K 4.0 3.9 3.7 4.0 4.3  CL 111 111 109 110 106  CO2 26 20* 23 23 25   GLUCOSE 107* 89 102* 107* 98  BUN 29* 26* 26* 23 26*  CREATININE 1.03* 0.91 0.91 0.91 0.94  CALCIUM 8.0* 8.1* 8.2* 8.1* 8.3*  MG 2.2  --   --   --   --    Liver Function Tests: Recent Labs  Lab 07/17/21 2055 07/18/21 0417  AST 21 18  ALT 17 17  ALKPHOS 64 60  BILITOT 0.5 0.5  PROT 7.3 6.6  ALBUMIN 2.9* 2.6*   No results for input(s): LIPASE, AMYLASE in the last 168 hours. No results for input(s): AMMONIA in the last 168 hours. CBC: Recent Labs  Lab 07/18/21 0417 07/19/21 0647 07/20/21 0641 07/21/21 0602 07/04/2021 0543  WBC 14.6* 13.1* 12.3* 12.0* 11.8*  NEUTROABS 9.9* 8.8* 7.9* 7.1 7.0  HGB 8.2* 8.0* 8.3* 8.2* 8.3*  HCT 27.6* 26.9* 27.6* 27.0* 27.0*  MCV 93.6 93.7 92.3 91.5 91.5  PLT 295 299 335 375 375   Cardiac Enzymes: No results for input(s): CKTOTAL, CKMB, CKMBINDEX, TROPONINI in the last 168 hours. BNP: Invalid input(s): POCBNP CBG: No results for input(s):  GLUCAP in the last 168 hours. D-Dimer No results for input(s): DDIMER in the last 72 hours. Hgb A1c No results for input(s): HGBA1C in the last 72 hours. Lipid Profile No results for input(s): CHOL, HDL, LDLCALC, TRIG, CHOLHDL, LDLDIRECT in the last 72 hours. Thyroid function studies No results for input(s): TSH, T4TOTAL, T3FREE, THYROIDAB in the last 72 hours.  Invalid input(s): FREET3 Anemia work up No results for input(s): VITAMINB12, FOLATE, FERRITIN, TIBC, IRON, RETICCTPCT in the last 72 hours. Urinalysis    Component Value Date/Time   COLORURINE YELLOW 07/17/2021 2115   APPEARANCEUR TURBID (A) 07/17/2021 2115   LABSPEC 1.020 07/17/2021 2115   PHURINE 7.0 07/17/2021 2115   GLUCOSEU NEGATIVE 07/17/2021 2115   HGBUR NEGATIVE 07/17/2021 2115   BILIRUBINUR NEGATIVE 07/17/2021 2115   KETONESUR 5 (A) 07/17/2021 2115   PROTEINUR 100 (A) 07/17/2021 2115   NITRITE NEGATIVE 07/17/2021 2115   LEUKOCYTESUR LARGE (A) 07/17/2021 2115   Sepsis Labs Invalid input(s): PROCALCITONIN,  WBC,  LACTICIDVEN Microbiology Recent Results (from the past 240 hour(s))  Culture, blood (Routine x 2)     Status: None   Collection Time: 07/17/21  8:12 PM   Specimen: BLOOD  Result Value Ref Range Status   Specimen Description BLOOD BLOOD LEFT FOREARM  Final   Special Requests   Final    BOTTLES DRAWN AEROBIC AND ANAEROBIC Blood Culture adequate volume   Culture   Final    NO GROWTH 5 DAYS Performed at [facility], [address] [place], [place] [address]    Report Status 07/24/2021 FINAL  Final  Resp Panel by RT-PCR (Flu A&B, Covid) Nasopharyngeal Swab     Status: None   Collection Time: 07/17/21  8:48 PM   Specimen: Nasopharyngeal Swab; Nasopharyngeal(NP) swabs in vial transport medium  Result Value Ref Range Status   SARS Coronavirus 2 by RT PCR NEGATIVE NEGATIVE Final    Comment: (NOTE) SARS-CoV-2 target nucleic acids are NOT DETECTED.  The SARS-CoV-2 RNA is generally detectable in upper  respiratory specimens during the acute phase of infection. The lowest concentration of SARS-CoV-2 viral copies this assay can detect is 138 copies/mL. A negative result does not preclude SARS-Cov-2 infection and should not be used as the sole basis for treatment or other patient management decisions. A negative result may occur with  improper specimen collection/handling, submission of specimen other than nasopharyngeal swab, presence of viral mutation(s) within the areas targeted by this assay, and inadequate number of viral copies(<138 copies/mL). A negative result must be combined with clinical observations, patient history, and epidemiological information. The expected result is Negative.  Fact Sheet for Patients:  BloggerCourse.com  Fact Sheet for Healthcare Providers:  SeriousBroker.it  This test is no t yet approved or cleared by the [place] FDA and  has been authorized for detection and/or diagnosis of SARS-CoV-2 by FDA under an Emergency Use Authorization (EUA). This EUA will remain  in effect (meaning this test can be used) for the duration of the COVID-19 declaration under Section 564(b)(1) of the Act, 21 U.S.C.section 360bbb-3(b)(1), unless the authorization is terminated  or revoked sooner.       Influenza A by PCR NEGATIVE NEGATIVE Final   Influenza B by PCR NEGATIVE NEGATIVE Final    Comment: (NOTE) The Xpert Xpress SARS-CoV-2/FLU/RSV plus assay is intended as an aid in the diagnosis of influenza from Nasopharyngeal swab specimens and should not be used as a sole basis for treatment. Nasal washings and aspirates are unacceptable for Xpert Xpress SARS-CoV-2/FLU/RSV testing.  Fact Sheet for Patients: BloggerCourse.com  Fact Sheet for Healthcare Providers: SeriousBroker.it  This test is not yet approved or cleared by the [place] FDA and has been  authorized for detection and/or diagnosis of SARS-CoV-2 by FDA under an Emergency Use Authorization (EUA). This EUA will remain in effect (meaning this test can be used) for the duration of the COVID-19 declaration under Section 564(b)(1) of the Act, 21 U.S.C. section 360bbb-3(b)(1), unless the authorization is terminated or revoked.  Performed at [facility], [address] [place], [place] [address]   Culture, blood (Routine x 2)     Status: Abnormal   Collection Time: 07/17/21  8:55 PM   Specimen: BLOOD  Result Value Ref Range Status   Specimen Description   Final    BLOOD BLOOD LEFT WRIST Performed at [facility], [address], [place], [place] [address]    Special Requests   Final    BOTTLES DRAWN AEROBIC AND ANAEROBIC Blood Culture adequate volume Performed at [facility], [address], [place], [place] [address]    Culture  Setup Time   Final    GRAM POSITIVE COCCI IN BOTH AEROBIC AND ANAEROBIC BOTTLES Gram Stain Report Called to,Read Back By and Verified With: DOSS,M AT 1424 ON  10.17.22 BY RUCINSKI,B CRITICAL RESULT CALLED TO, READ BACK BY AND VERIFIED WITH: MANDY WAX RN 07/18/2021  BY JW    Culture (A)  Final    STAPHYLOCOCCUS HOMINIS STAPHYLOCOCCUS EPIDERMIDIS THE SIGNIFICANCE OF ISOLATING THIS ORGANISM FROM A SINGLE SET OF BLOOD CULTURES WHEN MULTIPLE SETS ARE DRAWN IS UNCERTAIN. PLEASE NOTIFY THE MICROBIOLOGY DEPARTMENT WITHIN ONE WEEK IF SPECIATION AND SENSITIVITIES ARE REQUIRED. Performed at [facility], [address], [place], [place] [address]    Report Status 07/21/2021 FINAL  Final  Blood Culture ID Panel (Reflexed)     Status: Abnormal   Collection Time: 07/17/21  8:55 PM  Result Value Ref Range Status   Enterococcus faecalis NOT DETECTED NOT DETECTED Final   Enterococcus Faecium NOT DETECTED NOT DETECTED Final   Listeria monocytogenes NOT DETECTED NOT DETECTED Final   Staphylococcus species DETECTED (A) NOT DETECTED Final    Comment:  CRITICAL RESULT CALLED TO, READ BACK BY AND VERIFIED WITH: MANDY WAX RN 07/18/2021  BY JW    Staphylococcus aureus (BCID) NOT DETECTED NOT DETECTED Final   Staphylococcus epidermidis DETECTED (A) NOT DETECTED Final    Comment: CRITICAL RESULT CALLED TO, READ BACK BY AND VERIFIED WITH: MANDY WAX RN 07/18/2021  BY JW    Staphylococcus lugdunensis NOT DETECTED NOT DETECTED Final   Streptococcus species NOT DETECTED NOT DETECTED Final   Streptococcus agalactiae NOT DETECTED NOT DETECTED Final   Streptococcus pneumoniae NOT DETECTED NOT DETECTED Final   Streptococcus pyogenes NOT DETECTED NOT DETECTED Final   A.calcoaceticus-baumannii NOT DETECTED NOT DETECTED Final   Bacteroides fragilis NOT DETECTED NOT DETECTED Final   Enterobacterales NOT DETECTED NOT DETECTED Final   Enterobacter cloacae complex NOT DETECTED NOT DETECTED Final   Escherichia coli NOT DETECTED NOT DETECTED Final   Klebsiella aerogenes NOT DETECTED NOT DETECTED Final   Klebsiella oxytoca NOT DETECTED NOT DETECTED Final   Klebsiella pneumoniae NOT DETECTED NOT DETECTED Final   Proteus species NOT DETECTED NOT DETECTED Final   Salmonella species NOT DETECTED NOT DETECTED Final   Serratia marcescens NOT DETECTED NOT DETECTED Final   Haemophilus influenzae NOT DETECTED NOT DETECTED Final   Neisseria meningitidis NOT DETECTED NOT DETECTED Final   Pseudomonas aeruginosa NOT DETECTED NOT DETECTED Final   Stenotrophomonas maltophilia NOT DETECTED NOT DETECTED Final   Candida albicans NOT DETECTED NOT DETECTED Final   Candida auris NOT DETECTED NOT DETECTED Final   Candida glabrata NOT DETECTED NOT DETECTED Final   Candida krusei NOT DETECTED NOT DETECTED Final   Candida parapsilosis NOT DETECTED NOT DETECTED Final   Candida tropicalis NOT DETECTED NOT DETECTED Final   Cryptococcus neoformans/gattii NOT DETECTED NOT DETECTED Final   Methicillin resistance mecA/C NOT DETECTED NOT DETECTED Final    Comment: Performed  at [facility], [address], [place], [place] [address]  Urine Culture     Status: Abnormal   Collection Time: 07/17/21 11:00 PM   Specimen: Urine, Catheterized  Result Value Ref Range Status   Specimen Description   Final    URINE, CATHETERIZED Performed at [facility], [address], [place], [place] [address]    Special Requests   Final    NONE Performed at [facility], [address], [place], [place] [address]    Culture >=100,000 COLONIES/mL PROTEUS MIRABILIS (A)  Final   Report Status 07/20/2021 FINAL  Final   Organism ID, Bacteria PROTEUS MIRABILIS (A)  Final      Susceptibility   Proteus mirabilis - MIC*  AMPICILLIN <=2 SENSITIVE Sensitive     CEFAZOLIN <=4 SENSITIVE Sensitive     CEFEPIME <=0.12 SENSITIVE Sensitive     CEFTRIAXONE <=0.25 SENSITIVE Sensitive     CIPROFLOXACIN <=0.25 SENSITIVE Sensitive     GENTAMICIN <=1 SENSITIVE Sensitive     IMIPENEM 2 SENSITIVE Sensitive     NITROFURANTOIN 128 RESISTANT Resistant     TRIMETH/SULFA <=20 SENSITIVE Sensitive     AMPICILLIN/SULBACTAM <=2 SENSITIVE Sensitive     PIP/TAZO <=4 SENSITIVE Sensitive     * >=100,000 COLONIES/mL PROTEUS MIRABILIS  Respiratory (~20 pathogens) panel by PCR     Status: None   Collection Time: 07/18/21 11:00 AM   Specimen: Nasopharyngeal Swab; Respiratory  Result Value Ref Range Status   Adenovirus NOT DETECTED NOT DETECTED Final   Coronavirus 229E NOT DETECTED NOT DETECTED Final    Comment: (NOTE) The Coronavirus on the Respiratory Panel, DOES NOT test for the novel  Coronavirus (2019 nCoV)    Coronavirus HKU1 NOT DETECTED NOT DETECTED Final   Coronavirus NL63 NOT DETECTED NOT DETECTED Final   Coronavirus OC43 NOT DETECTED NOT DETECTED Final   Metapneumovirus NOT DETECTED NOT DETECTED Final   Rhinovirus / Enterovirus NOT DETECTED NOT DETECTED Final   Influenza A NOT DETECTED NOT DETECTED Final   Influenza B NOT DETECTED NOT DETECTED Final   Parainfluenza Virus 1 NOT DETECTED  NOT DETECTED Final   Parainfluenza Virus 2 NOT DETECTED NOT DETECTED Final   Parainfluenza Virus 3 NOT DETECTED NOT DETECTED Final   Parainfluenza Virus 4 NOT DETECTED NOT DETECTED Final   Respiratory Syncytial Virus NOT DETECTED NOT DETECTED Final   Bordetella pertussis NOT DETECTED NOT DETECTED Final   Bordetella Parapertussis NOT DETECTED NOT DETECTED Final   Chlamydophila pneumoniae NOT DETECTED NOT DETECTED Final   Mycoplasma pneumoniae NOT DETECTED NOT DETECTED Final    Comment: Performed at [facility], [address], [place], [place] [address]  MRSA Next Gen by PCR, Nasal     Status: None   Collection Time: 07/18/21 11:00 AM   Specimen: Nasal Mucosa; Nasal Swab  Result Value Ref Range Status   MRSA by PCR Next Gen NOT DETECTED NOT DETECTED Final    Comment: (NOTE) The GeneXpert MRSA Assay (FDA approved for NASAL specimens only), is one component of a comprehensive MRSA colonization surveillance program. It is not intended to diagnose MRSA infection nor to guide or monitor treatment for MRSA infections. Test performance is not FDA approved in patients less than 47 years old. Performed at [facility], [address], [place], [place] [address]   Culture, blood (routine x 2)     Status: None (Preliminary result)   Collection Time: 07/20/21  6:36 AM   Specimen: BLOOD LEFT HAND  Result Value Ref Range Status   Specimen Description BLOOD LEFT HAND  Final   Special Requests   Final    Blood Culture adequate volume BOTTLES DRAWN AEROBIC AND ANAEROBIC   Culture   Final    NO GROWTH 2 DAYS Performed at [facility], [address], [place], [place] [address]    Report Status PENDING  Incomplete  Culture, blood (routine x 2)     Status: None (Preliminary result)   Collection Time: 07/20/21  6:41 AM   Specimen: BLOOD RIGHT HAND  Result Value Ref Range Status   Specimen Description BLOOD RIGHT HAND  Final   Special Requests   Final    Blood Culture adequate volume BOTTLES  DRAWN AEROBIC AND ANAEROBIC  Culture   Final    NO GROWTH 2 DAYS Performed at [facility], [address], [place], [place] [address]    Report Status PENDING  Incomplete     Patient was seen and examined on the day of discharge and was found to be in stable condition. Time coordinating discharge: 35 minutes including assessment and coordination of care, as well as examination of the patient.   SIGNED:  Noralee Stain, DO [facility] 08/01/2021, 10:39 AM

## 2021-07-22 NOTE — Care Management Important Message (Signed)
Important Message  Patient Details  Name: Debra Pratt MRN: [id number] Date of Birth: 02/12/1925   Medicare Important Message Given:  Yes     Corey Harold 07/03/2021, 11:05 AM

## 2021-07-22 NOTE — Progress Notes (Signed)
Nsg Discharge Note  Admit Date:  07/17/2021 Discharge date: 07/06/2021   Dianna Rossetti to be D/C'd Skilled nursing facility per MD order.  AVS completed.  Copy for chart, and copy for patient signed, and dated. Patient/caregiver able to verbalize understanding.  Discharge Medication: Allergies as of 07/31/2021   No Known Allergies      Medication List     STOP taking these medications    amLODipine 2.5 MG tablet Commonly known as: NORVASC   aspirin EC 81 MG tablet   clopidogrel 75 MG tablet Commonly known as: PLAVIX       TAKE these medications    acetaminophen 500 MG tablet Commonly known as: TYLENOL Take 500 mg by mouth every 6 (six) hours as needed for mild pain, fever or headache.   albuterol 108 (90 Base) MCG/ACT inhaler Commonly known as: VENTOLIN HFA Inhale 1-2 puffs into the lungs every 6 (six) hours as needed for wheezing or shortness of breath.   apixaban 5 MG Tabs tablet Commonly known as: ELIQUIS Take 1 tablet (5 mg total) by mouth 2 (two) times daily.   atorvastatin 80 MG tablet Commonly known as: Lipitor Take 1 tablet (80 mg total) by mouth daily. What changed:  medication strength how much to take   CALCIUM 500 PO Take 1 tablet by mouth daily.   cephALEXin 500 MG capsule Commonly known as: KEFLEX Take 1 capsule (500 mg total) by mouth every 6 (six) hours for 3 days.   D3 5000 125 MCG (5000 UT) capsule Generic drug: Cholecalciferol Take 5,000 Units by mouth daily.   metoprolol succinate 25 MG 24 hr tablet Commonly known as: TOPROL-XL Take 1 tablet (25 mg total) by mouth daily. Start taking on: July 23, 2021   Symbicort 80-4.5 MCG/ACT inhaler Generic drug: budesonide-formoterol Inhale 2 puffs into the lungs 2 (two) times daily.   vitamin B-12 500 MCG tablet Commonly known as: CYANOCOBALAMIN Take 500 mcg by mouth daily.        Discharge Assessment: Vitals:   07/11/2021 0812 07/05/2021 0904  BP:  (!) 126/57  Pulse:  86   Resp:    Temp:    SpO2: 93%    Skin clean, dry and intact without evidence of skin break down, no evidence of skin tears noted. IV catheter discontinued intact. Site without signs and symptoms of complications - no redness or edema noted at insertion site, patient denies c/o pain - only slight tenderness at site.  Dressing with slight pressure applied.  D/c Instructions-Education: Discharge instructions given to patient/family with verbalized understanding. D/c education completed with patient/family including follow up instructions, medication list, d/c activities limitations if indicated, with other d/c instructions as indicated by MD - patient able to verbalize understanding, all questions fully answered. Patient instructed to return to ED, call 911, or call MD for any changes in condition.  Patient escorted via WC, and D/C home via private auto.  Brandy Hale, LPN 77/41/[id number] 6:76 PM

## 2021-07-22 NOTE — Evaluation (Addendum)
Clinical/Bedside Swallow Evaluation Patient Details  Name: Debra Pratt MRN: [id number] Date of Birth: [birth year]  Today's Date: August 01, 2021 Time: SLP Start Time (ACUTE ONLY): 1120 SLP Stop Time (ACUTE ONLY): 1144 SLP Time Calculation (min) (ACUTE ONLY): 24 min  Past Medical History:  Past Medical History:  Diagnosis Date   Dementia (HCC)    Past Surgical History: History reviewed. No pertinent surgical history. HPI:  Debra Pratt  is a 87 y.o. female, with history of dementia presents the ED with a chief complaint of fever. History is limited due to patient's dementia. Pt resides at ALF [facility], reported that she had a temperature of 100 and had an episode of vomiting, cough, wheezing, loss of appetite and generalized weakness. Of note, pt is very HOH. No further history could be obtained. In the ED, temp 101.3, respiratory rate as high as 32, heart rate as high as 131, blood pressure as low as 97/65, satting at 96% on 2 L nasal cannula, leukocytosis 15.9, hemoglobin 8.8 down from about 12 in 8/22. Flu and covid negative. UA positive for UTI, culture pending. Chest x-ray shows mild interstitial opacity without focal airspace disease that could indicate viral infection. Patient admitted for further management. Repeat chest x-ray revealed increasing patchy opacity in the right lower lobe concerning for pneumonia. BSE requested.    Assessment / Plan / Recommendation  Clinical Impression  Pt aroused from deep sleep to assess swallowing function and oral holding/prolonged oral transit noted with all consistencies likely d/t cognitive-based dysphagia with hx of dementia at baseline/SNF resident.  Pt unable to cough/initiate swallow on command during BSE, but no overt s/s of aspiration noted during assessment, despite pt being min lethargic when consuming small bites/sips of various consistencies including: ice chips, thin via spoon/cup (attempted straw, but pt unable to suck through straw at time of  evaluation, but nursing stated she had no difficulty when alert earlier this date) and cracker.  Impaired mastication noted with solids, but pt had just been aroused from sleep.  Discussed BSE with nursing staff and potential need for nectar-thickened liquids if mentation changes with hx of dementia and RLL opacities seen on CXR on 07/21/21.  Vocal quality unable to be assessed d/t nonverbal state.  Continue current diet with slow rate/small bites and sips guided by caregiver. If s/s of aspiration are noted, nectar-thickened liquids may be considered prn.  ST will f/u for diet tolerance in acute setting.  Thank you for this consult. SLP Visit Diagnosis: Dysphagia, unspecified (R13.10)    Aspiration Risk  Mild aspiration risk    Diet Recommendation   Regular/thin (as tolerated); may consider nectar-thickened liquids prn   Medication Administration: Whole meds with liquid (as tolerated)    Other  Recommendations Oral Care Recommendations: Oral care BID    Recommendations for follow up therapy are one component of a multi-disciplinary discharge planning process, led by the attending physician.  Recommendations may be updated based on patient status, additional functional criteria and insurance authorization.  Follow up Recommendations Skilled Nursing facility;24 hour supervision/assistance      Frequency and Duration min 1 x/week  1 week       Prognosis Prognosis for Safe Diet Advancement: Good Barriers to Reach Goals: Cognitive deficits      Swallow Study   General Date of Onset: 07/20/21 HPI: Debra Pratt  is a 87 y.o. female, with history of dementia presents the ED with a chief complaint of fever. History is limited due to patient's dementia. Pt resides at [facility], reported that she had a temperature of 100 and had an episode of vomiting, cough, wheezing, loss of appetite and generalized weakness. Of note, pt is very HOH. No further history could be obtained. In the ED, temp  101.3, respiratory rate as high as 32, heart rate as high as 131, blood pressure as low as 97/65, satting at 96% on 2 L nasal cannula, leukocytosis 15.9, hemoglobin 8.8 down from about 12 in 8/22. Flu and covid negative. UA positive for UTI, culture pending. Chest x-ray shows mild interstitial opacity without focal airspace disease that could indicate viral infection. Patient admitted for further management. Repeat chest x-ray revealed increasing patchy opacity in the right lower lobe concerning for pneumonia. BSE requested. Type of Study: Bedside Swallow Evaluation Previous Swallow Assessment: n/a Diet Prior to this Study: Regular;Thin liquids Temperature Spikes Noted: No Respiratory Status: Room air History of Recent Intubation: No Behavior/Cognition: Lethargic/Drowsy;Requires cueing Oral Cavity Assessment: Dry;Other (comment) (sleeping with open mouth posture) Oral Care Completed by SLP: Recent completion by staff Oral Cavity - Dentition: Dentures, top;Dentures, bottom Self-Feeding Abilities: Total assist Patient Positioning: Upright in bed Baseline Vocal Quality: Not observed Volitional Cough: Cognitively unable to elicit Volitional Swallow: Unable to elicit    Oral/Motor/Sensory Function Overall Oral Motor/Sensory Function: Generalized oral weakness (unable to assess d/t cognitive impairment)   Ice Chips Ice chips: Impaired Presentation: Spoon Oral Phase Functional Implications: Oral holding Pharyngeal Phase Impairments: Suspected delayed Swallow   Thin Liquid Thin Liquid: Impaired Presentation: Cup;Spoon;Straw Oral Phase Functional Implications: Oral holding Pharyngeal  Phase Impairments: Suspected delayed Swallow    Nectar Thick Nectar Thick Liquid: Impaired Presentation: Spoon Oral phase functional implications: Oral holding Pharyngeal Phase Impairments: Suspected delayed Swallow   Honey Thick Honey Thick Liquid: Not tested   Puree Puree: Impaired Presentation: Spoon Oral  Phase Functional Implications: Oral holding Pharyngeal Phase Impairments: Suspected delayed Swallow   Solid     Solid: Impaired Presentation: Spoon Oral Phase Impairments: Impaired mastication Oral Phase Functional Implications: Oral holding;Impaired mastication Pharyngeal Phase Impairments: Suspected delayed Swallow      Tressie Stalker, M.S., CCC-SLP 07/24/2021,12:03 PM

## 2021-07-25 ENCOUNTER — Non-Acute Institutional Stay (SKILLED_NURSING_FACILITY): Payer: Medicare Other | Admitting: Adult Health

## 2021-07-25 ENCOUNTER — Encounter: Payer: Self-pay | Admitting: Adult Health

## 2021-07-25 DIAGNOSIS — N390 Urinary tract infection, site not specified: Secondary | ICD-10-CM

## 2021-07-25 DIAGNOSIS — A419 Sepsis, unspecified organism: Secondary | ICD-10-CM | POA: Diagnosis not present

## 2021-07-25 DIAGNOSIS — J9601 Acute respiratory failure with hypoxia: Secondary | ICD-10-CM

## 2021-07-25 DIAGNOSIS — J45909 Unspecified asthma, uncomplicated: Secondary | ICD-10-CM

## 2021-07-25 DIAGNOSIS — I10 Essential (primary) hypertension: Secondary | ICD-10-CM

## 2021-07-25 DIAGNOSIS — F0390 Unspecified dementia without behavioral disturbance: Secondary | ICD-10-CM

## 2021-07-25 DIAGNOSIS — I639 Cerebral infarction, unspecified: Secondary | ICD-10-CM

## 2021-07-25 DIAGNOSIS — E440 Moderate protein-calorie malnutrition: Secondary | ICD-10-CM

## 2021-07-25 DIAGNOSIS — E782 Mixed hyperlipidemia: Secondary | ICD-10-CM

## 2021-07-25 DIAGNOSIS — I4891 Unspecified atrial fibrillation: Secondary | ICD-10-CM

## 2021-07-25 LAB — CULTURE, BLOOD (ROUTINE X 2)
Culture: NO GROWTH
Culture: NO GROWTH
Special Requests: ADEQUATE
Special Requests: ADEQUATE

## 2021-07-25 NOTE — Progress Notes (Signed)
Location:  [facility] Room Number: 143 Place of Service:  SNF (31)   CODE STATUS: dnr  No Known Allergies  Chief Complaint  Patient presents with   Hospitalization Follow-up    HPI:  She is a 87 year old woman who has been hospitalized from 07-17-21 through Aug 19, 2021. She had been living in assisted living prior to her hospitalization. She presented to the ED with fever 101.3; nausea and vomiting. She was treated for proteus UTI and sepsis  her blood cultures returned negative. She developed afib with rvr her rate is controlled with a betablocker. Her plavix was changed to eliquis; in light of recent cva. She will need 3 more days of keflex post hospitalization. She is here for short term rehab. There are no indications of pain; no indications of anxiety. She will continue to be followed for her chronic illnesses:  Atrial fibrillation with RVR;  Cerebral vascular accident (CVA) unspecified mechanism:. Essential hypertension: Dementia without behavioral disturbance:  Past Medical History:  Diagnosis Date   Dementia (HCC)     No past surgical history on file.  Social History   Socioeconomic History   Marital status: Widowed    Spouse name: Not on file   Number of children: Not on file   Years of education: Not on file   Highest education level: Not on file  Occupational History   Not on file  Tobacco Use   Smoking status: Former    Years: 20.00    Types: Cigarettes   Smokeless tobacco: Never  Substance and Sexual Activity   Alcohol use: Not Currently   Drug use: Not Currently   Sexual activity: Not Currently  Other Topics Concern   Not on file  Social History Narrative   Not on file   Social Determinants of Health   Financial Resource Strain: Not on file  Food Insecurity: Not on file  Transportation Needs: Not on file  Physical Activity: Not on file  Stress: Not on file  Social Connections: Not on file  Intimate Partner Violence: Not on  file   No family history on file.    VITAL SIGNS BP (!) 146/90   Pulse 80   Temp 98.2 F (36.8 C)   Ht 5\' 7"  (1.702 m)   Wt 186 lb (84.4 kg)   BMI 29.13 kg/m   Outpatient Encounter Medications as of 07/25/2021  Medication Sig   acetaminophen (TYLENOL) 500 MG tablet Take 500 mg by mouth every 6 (six) hours as needed for mild pain, fever or headache.   albuterol (VENTOLIN HFA) 108 (90 Base) MCG/ACT inhaler Inhale 1-2 puffs into the lungs every 6 (six) hours as needed for wheezing or shortness of breath.   apixaban (ELIQUIS) 5 MG TABS tablet Take 1 tablet (5 mg total) by mouth 2 (two) times daily.   atorvastatin (LIPITOR) 80 MG tablet Take 1 tablet (80 mg total) by mouth daily.   Calcium Carbonate (CALCIUM 500 PO) Take 1 tablet by mouth daily.   cephALEXin (KEFLEX) 500 MG capsule Take 1 capsule (500 mg total) by mouth every 6 (six) hours for 3 days.   Cholecalciferol (D3 5000) 125 MCG (5000 UT) capsule Take 5,000 Units by mouth daily.   metoprolol succinate (TOPROL-XL) 25 MG 24 hr tablet Take 1 tablet (25 mg total) by mouth daily.   SYMBICORT 80-4.5 MCG/ACT inhaler Inhale 2 puffs into the lungs 2 (two) times daily.   vitamin B-12 (CYANOCOBALAMIN) 500 MCG tablet Take 500 mcg  by mouth daily.   No facility-administered encounter medications on file as of 07/25/2021.     SIGNIFICANT DIAGNOSTIC EXAMS  TODAY  07-17-21: chest x-ray: Diffuse mild interstitial opacity without focal airspace disease. This could indicate viral infection  07-21-21: chest x-ray: Increasing patchy opacity in the right lower lobe concerning for pneumonia.  LABS REVIEWED TODAY  05-19-21: hgb a1c 5.8; chol  269; ldl 189; trig 88; hdl 62 07-17-21: wbc 15.9; hgb 8.8; hct 28.4; mcv 93.1 plt 301; glucose 121; bun 34; creat 1.11; k+ 4.0; na++ 137; ca 8.3; GFR 46; liver normal albumin 2.9 07-19-21: vit B 12: 1200; folate 15.3; iron 11; tibc 289; ferritin 100 08/11/2021: wbc 11.8; hgb 8.3; hct 27.0; mcv 91.5 plt  375; glucose 98; bun 26; creat 4.3; na++ 137; ca 8.3; GFR 56   Review of Systems  Unable to perform ROS: Dementia (unable to fully participate)    Physical Exam Constitutional:      General: She is not in acute distress.    Appearance: She is well-developed. She is not diaphoretic.  Neck:     Thyroid: No thyromegaly.  Cardiovascular:     Rate and Rhythm: Normal rate. Rhythm irregular.     Pulses: Normal pulses.     Heart sounds: Normal heart sounds.  Pulmonary:     Effort: Pulmonary effort is normal. No respiratory distress.     Breath sounds: Normal breath sounds.  Abdominal:     General: Bowel sounds are normal. There is no distension.     Palpations: Abdomen is soft.     Tenderness: There is no abdominal tenderness.  Musculoskeletal:     Cervical back: Neck supple.     Right lower leg: No edema.     Left lower leg: No edema.     Comments: Able to move all extremities   Lymphadenopathy:     Cervical: No cervical adenopathy.  Skin:    General: Skin is warm and dry.  Neurological:     Mental Status: She is alert. Mental status is at baseline.  Psychiatric:        Mood and Affect: Mood normal.      ASSESSMENT/ PLAN:  TODAY  Sepsis without acute organ dysfunction due to unspecified organism/urinary tract infection without hematuria site unspecified: is stable will continue keflex 500 mg every 6 hours for total of 3 days will monitor her status.   2. Acute respiratory failure with hypoxia / asthma unspecified asthma severity unspecified whether complicated unspecified whether persistent: is stable will continue symbicort 80/4.5 mcg 2 puffs twice daily has albuterol 1-2 puffs every 6 hours as needed  3. Atrial fibrillation with RVR; heart rate is stable; remains irregular: will continue toprol xl 25 mg daily for rate controled; will continue eliquis 5 mg twice daily   4. Cerebral vascular accident (CVA) unspecified mechanism:is stable will continue eliquis 5 mg twice  daily   5. Essential hypertension: is stable b/p 146/90 will continue toprol xl 25 mg daily   6. Dementia without behavioral disturbance: weight is 186 pounds will monitor   7. Moderate protein calorie malnutrition: albumin 2.9. will continue supplements as directed and will begin prostat 18mL with meals.   8. Mixed hyperlipidemia: is stable due to advanced age: will stop lipitor at this time. Will monitor her status.       Synthia Innocent NP [facility]  Contact [phone] Monday through Friday 8am- 5pm  After hours call [phone]

## 2021-07-26 ENCOUNTER — Non-Acute Institutional Stay (SKILLED_NURSING_FACILITY): Payer: Medicare Other | Admitting: Internal Medicine

## 2021-07-26 ENCOUNTER — Encounter: Payer: Self-pay | Admitting: Internal Medicine

## 2021-07-26 DIAGNOSIS — J9601 Acute respiratory failure with hypoxia: Secondary | ICD-10-CM

## 2021-07-26 DIAGNOSIS — I10 Essential (primary) hypertension: Secondary | ICD-10-CM | POA: Diagnosis not present

## 2021-07-26 DIAGNOSIS — I4891 Unspecified atrial fibrillation: Secondary | ICD-10-CM | POA: Diagnosis not present

## 2021-07-26 DIAGNOSIS — N390 Urinary tract infection, site not specified: Secondary | ICD-10-CM | POA: Insufficient documentation

## 2021-07-26 DIAGNOSIS — A419 Sepsis, unspecified organism: Secondary | ICD-10-CM | POA: Diagnosis not present

## 2021-07-26 NOTE — Patient Instructions (Signed)
See assessment and plan under each diagnosis in the problem list and acutely for this visit 

## 2021-07-26 NOTE — Progress Notes (Signed)
NURSING HOME LOCATION: [facility] ROOM NUMBER:  [id number]  CODE STATUS:  DNR  PCP:  formerly the Medical Director @ [facility]  This is a comprehensive admission note to this SNFperformed on this date less than 30 days from date of admission. Included are preadmission medical/surgical history; reconciled medication list; family history; social history and comprehensive review of systems.  Corrections and additions to the records were documented. Comprehensive physical exam was also performed. Additionally a clinical summary was entered for each active diagnosis pertinent to this admission in the Problem List to enhance continuity of care.  HPI: She was hospitalized 10/16 - Aug 15, 2021, presenting to the ED with fever.  Patient was a resident of [facility]; staff documented a temperature of 100 in the context of an episode of vomiting, cough, wheezing, anorexia, & generalized weakness.  Temp in the ED was documented as 101.3 with respiratory rate as high as 32 and tachycardia up to 131.  She was hypotensive with blood pressure down to 97/65.  Initial chest x-ray revealed mild interstitial opacities; follow-up film suggested increasing patchy opacity in the right lower lobe clinically worrisome for pneumonia. White count was 15,900; hemoglobin was 8.8 down from prior value of 12 in August.  Flu and COVID testing were negative.  Urinalysis was abnormal; culture subsequently grew Proteus. Blood culture grew Staph epidermitis, clinically contaminant.  Repeat blood cultures were negative.  Antibiotic regimen was de-escalated to Keflex. Course was complicated by A. fib with RVR which responded to beta-blocker administration. CHA2DS2-VASc Score = 8.  Plavix was changed to Eliquis in the setting of the PAF and recent history of CVA.  The anemia remained stable during hospitalization in the range of 8 without other bleeding dyscrasias. SNF placement for rehab was recommended.  Past  medical and surgical history: Includes COPD, dyslipidemia, Grade 1 diastolic dysfunction, CKD Stage 3,and dementia.  No past surgical history on file.  Social history: Apparently former smoker and drinker.  Family history: Noncontributory due to advanced age.   Review of systems: Profound deafness & clinical neurocognitive deficits made validity of responses questionable ,preventing ROS completion. Date given as "October 2022" but she could not name the POTUS or tell me why she had been hospitalized. One answer was "arthritis".  Physical exam:  Pertinent or positive findings: She appears her stated age.  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  Facies are blank.  Mouth is agape.  She has arcus senilis.  Eyebrows are essentially absent.  Complete dentures are present.  There is slight slurring of the first heart sound.  She has diffuse low-grade rales without increased work of breathing.  Abdomen is protuberant.  There is 1/2+ edema at the sock line.  Pedal pulses are decreased.  She had difficulty following commands such as opposing my hand to test strength.  The right upper and right lower extremities did seem clinically weaker than the left but this cannot be verified.  General appearance:  no acute distress, increased work of breathing is present.   Lymphatic: No lymphadenopathy about the head, neck, axilla. Eyes: No conjunctival inflammation or lid edema is present. There is no scleral icterus. Ears:  External ear exam shows no significant lesions or deformities.   Nose:  External nasal examination shows no deformity or inflammation. Nasal mucosa are pink and moist without lesions, exudates Neck:  No thyromegaly, masses, tenderness noted.    Heart:  Normal rate and regular rhythm. S2 normal without gallop, murmur, click, rub.  Lungs:  without wheezes, rhonchi, rubs. Abdomen: Bowel sounds are normal.  Abdomen is soft and nontender with no organomegaly, hernias, masses. GU: Deferred  Extremities:  No  cyanosis, clubbing. Neurologic exam:Balance, Rhomberg, finger to nose testing could not be completed due to clinical state Skin: Warm & dry w/o tenting. No significant lesions or rash.  See clinical summary under each active problem in the Problem List with associated updated therapeutic plan

## 2021-07-26 NOTE — Assessment & Plan Note (Signed)
BP controlled; no change in antihypertensive medications  

## 2021-07-27 DIAGNOSIS — E782 Mixed hyperlipidemia: Secondary | ICD-10-CM | POA: Insufficient documentation

## 2021-07-29 NOTE — Assessment & Plan Note (Signed)
Clinically stable; afebrile.

## 2021-07-29 NOTE — Assessment & Plan Note (Signed)
Rate controlled; no change indicated 

## 2021-07-29 NOTE — Assessment & Plan Note (Addendum)
Despite presence of rales, O2 sats excellent; no respiratory compromise. Repeat CXR if hypoxic.

## 2021-08-02 DEATH — deceased

## 2021-08-05 ENCOUNTER — Non-Acute Institutional Stay (SKILLED_NURSING_FACILITY): Payer: Medicare Other | Admitting: Adult Health

## 2021-08-05 ENCOUNTER — Encounter: Payer: Self-pay | Admitting: Adult Health

## 2021-08-05 DIAGNOSIS — F0390 Unspecified dementia without behavioral disturbance: Secondary | ICD-10-CM | POA: Diagnosis not present

## 2021-08-05 DIAGNOSIS — I4891 Unspecified atrial fibrillation: Secondary | ICD-10-CM | POA: Diagnosis not present

## 2021-08-05 DIAGNOSIS — I639 Cerebral infarction, unspecified: Secondary | ICD-10-CM

## 2021-08-05 DIAGNOSIS — E440 Moderate protein-calorie malnutrition: Secondary | ICD-10-CM

## 2021-08-05 NOTE — Progress Notes (Signed)
Location:  [facility] Room Number: 143 Place of Service:  SNF (31)   CODE STATUS: dnr   No Known Allergies  Chief Complaint  Patient presents with   Acute Visit    Care plan meeting.     HPI:  We have come together for her care plan meeting. Family present. BIMS 12/15 mood 4/30: not sleeping well; some depression, she requires extensive assist to dependent for her adls care. She is nonambulatory. She is frequently incontinent of bladder and bowel. There have been no falls. Dietary: feeds self. She has a good appetite; weight stable; regular diet. She does have a lot of snacks. Therapy: PT: not ambulatory; standing stretching; using stand lift to get out of bed to wheelchair. OT: adls; assistance. ST cognition did cough with water. She continues to be followed for her chronic illnesses including: Atrial fibrillation with RVR   Dementia without behavioral disturbance  Moderate protein calorie malnutrition Cerebrovascular accident (CVA) unspecified mechanism      Past Medical History:  Diagnosis Date   Dementia (HCC)     No past surgical history on file.  Social History   Socioeconomic History   Marital status: Widowed    Spouse name: Not on file   Number of children: Not on file   Years of education: Not on file   Highest education level: Not on file  Occupational History   Not on file  Tobacco Use   Smoking status: Former    Years: 20.00    Types: Cigarettes   Smokeless tobacco: Never  Substance and Sexual Activity   Alcohol use: Not Currently   Drug use: Not Currently   Sexual activity: Not Currently  Other Topics Concern   Not on file  Social History Narrative   Not on file   Social Determinants of Health   Financial Resource Strain: Not on file  Food Insecurity: Not on file  Transportation Needs: Not on file  Physical Activity: Not on file  Stress: Not on file  Social Connections: Not on file  Intimate Partner Violence: Not on file    No family history on file.    VITAL SIGNS BP 131/78   Pulse 66   Temp (!) 97.2 F (36.2 C)   Resp 18   Ht 5\' 7"  (1.702 m)   Wt 179 lb 6.4 oz (81.4 kg)   SpO2 96%   BMI 28.10 kg/m   Outpatient Encounter Medications as of 08/05/2021  Medication Sig   acetaminophen (TYLENOL) 500 MG tablet Take 500 mg by mouth every 6 (six) hours as needed for mild pain, fever or headache.   albuterol (VENTOLIN HFA) 108 (90 Base) MCG/ACT inhaler Inhale 1-2 puffs into the lungs every 6 (six) hours as needed for wheezing or shortness of breath.   apixaban (ELIQUIS) 5 MG TABS tablet Take 1 tablet (5 mg total) by mouth 2 (two) times daily.   atorvastatin (LIPITOR) 80 MG tablet Take 1 tablet (80 mg total) by mouth daily.   Calcium Carbonate (CALCIUM 500 PO) Take 1 tablet by mouth daily.   Cholecalciferol (D3 5000) 125 MCG (5000 UT) capsule Take 5,000 Units by mouth daily.   metoprolol succinate (TOPROL-XL) 25 MG 24 hr tablet Take 1 tablet (25 mg total) by mouth daily.   SYMBICORT 80-4.5 MCG/ACT inhaler Inhale 2 puffs into the lungs 2 (two) times daily.   vitamin B-12 (CYANOCOBALAMIN) 500 MCG tablet Take 500 mcg by mouth daily.   No facility-administered encounter medications  on file as of 08/05/2021.     SIGNIFICANT DIAGNOSTIC EXAMS   PREVIOUS   07-17-21: chest x-ray: Diffuse mild interstitial opacity without focal airspace disease. This could indicate viral infection  07-21-21: chest x-ray: Increasing patchy opacity in the right lower lobe concerning for pneumonia.  NO NEW EXAMS.   LABS REVIEWED PREVIOUS   05-19-21: hgb a1c 5.8; chol  269; ldl 189; trig 88; hdl 62 07-17-21: wbc 15.9; hgb 8.8; hct 28.4; mcv 93.1 plt 301; glucose 121; bun 34; creat 1.11; k+ 4.0; na++ 137; ca 8.3; GFR 46; liver normal albumin 2.9 07-19-21: vit B 12: 1200; folate 15.3; iron 11; tibc 289; ferritin 100 07/26/21: wbc 11.8; hgb 8.3; hct 27.0; mcv 91.5 plt 375; glucose 98; bun 26; creat 4.3; na++ 137; ca 8.3; GFR 56    NO NEW EXAMS.   Review of Systems  Unable to perform ROS: Dementia (unable to fully participate)   Physical Exam Constitutional:      General: She is not in acute distress.    Appearance: She is well-developed. She is not diaphoretic.  Neck:     Thyroid: No thyromegaly.  Cardiovascular:     Rate and Rhythm: Normal rate. Rhythm irregular.     Pulses: Normal pulses.     Heart sounds: Normal heart sounds.  Pulmonary:     Effort: Pulmonary effort is normal. No respiratory distress.     Breath sounds: Normal breath sounds.  Abdominal:     General: Bowel sounds are normal. There is no distension.     Palpations: Abdomen is soft.     Tenderness: There is no abdominal tenderness.  Musculoskeletal:     Cervical back: Neck supple.     Right lower leg: No edema.     Left lower leg: No edema.     Comments:  Able to move all extremities    Lymphadenopathy:     Cervical: No cervical adenopathy.  Skin:    General: Skin is warm and dry.  Neurological:     Mental Status: She is alert. Mental status is at baseline.  Psychiatric:        Mood and Affect: Mood normal.      ASSESSMENT/ PLAN:  TODAY  Atrial fibrillation with RVR Dementia without behavioral disturbance Moderate protein calorie malnutrition Cerebrovascular accident (CVA) unspecified mechanism       Will continue therapy as directed Will continue current medications Will continue current plan of care Will monitor her status.    Time spent with patient: 40 minutes: care plan medications; therapy needs; goals of care.     Synthia Innocent NP [facility]  Contact [phone] Monday through Friday 8am- 5pm  After hours call [phone]

## 2021-08-31 ENCOUNTER — Non-Acute Institutional Stay (SKILLED_NURSING_FACILITY): Payer: Medicare Other | Admitting: Adult Health

## 2021-08-31 ENCOUNTER — Encounter: Payer: Self-pay | Admitting: Adult Health

## 2021-08-31 DIAGNOSIS — I639 Cerebral infarction, unspecified: Secondary | ICD-10-CM

## 2021-08-31 DIAGNOSIS — J45909 Unspecified asthma, uncomplicated: Secondary | ICD-10-CM | POA: Diagnosis not present

## 2021-08-31 DIAGNOSIS — I4891 Unspecified atrial fibrillation: Secondary | ICD-10-CM | POA: Diagnosis not present

## 2021-08-31 DIAGNOSIS — J9601 Acute respiratory failure with hypoxia: Secondary | ICD-10-CM | POA: Diagnosis not present

## 2021-08-31 NOTE — Progress Notes (Signed)
Location:  [facility] Room Number: 140-W Place of Service:  SNF (31)   CODE STATUS: DNR  No Known Allergies  Chief Complaint  Patient presents with   Medical Management of Chronic Issues               Acute respiratory failure with hypoxia / asthma unspecified asthma severity unspecified whether complicated unspecified whether persistent:  Atrial fibrillation with RVR;   Cerebral vascular accident (CVA) unspecified mechanism    HPI:  She is a 87 year old short term rehab patient being seen for the management of her chronic illnesses:  Acute respiratory failure with hypoxia / asthma unspecified asthma severity unspecified whether complicated unspecified whether persistent:  Atrial fibrillation with RVR;   Cerebral vascular accident (CVA) unspecified mechanism. There are no reports of uncontrolled pain. No reports of changes in appetite.   Past Medical History:  Diagnosis Date   Dementia (HCC)     History reviewed. No pertinent surgical history.  Social History   Socioeconomic History   Marital status: Widowed    Spouse name: Not on file   Number of children: Not on file   Years of education: Not on file   Highest education level: Not on file  Occupational History   Not on file  Tobacco Use   Smoking status: Former    Years: 20.00    Types: Cigarettes   Smokeless tobacco: Never  Substance and Sexual Activity   Alcohol use: Not Currently   Drug use: Not Currently   Sexual activity: Not Currently  Other Topics Concern   Not on file  Social History Narrative   Not on file   Social Determinants of Health   Financial Resource Strain: Not on file  Food Insecurity: Not on file  Transportation Needs: Not on file  Physical Activity: Not on file  Stress: Not on file  Social Connections: Not on file  Intimate Partner Violence: Not on file   History reviewed. No pertinent family history.    VITAL SIGNS BP 128/73   Pulse 84   Temp 97.7 F  (36.5 C)   Resp 20   Ht 5\' 7"  (1.702 m)   Wt 179 lb 6.4 oz (81.4 kg)   SpO2 95%   BMI 28.10 kg/m   Outpatient Encounter Medications as of 08/31/2021  Medication Sig   acetaminophen (TYLENOL) 500 MG tablet Take 500 mg by mouth every 6 (six) hours as needed for mild pain, fever or headache.   albuterol (VENTOLIN HFA) 108 (90 Base) MCG/ACT inhaler Inhale 1-2 puffs into the lungs every 6 (six) hours as needed for wheezing or shortness of breath.   Amino Acids-Protein Hydrolys (FEEDING SUPPLEMENT, PRO-STAT SUGAR FREE 64,) LIQD Take 30 mLs by mouth 3 (three) times daily with meals. Special Instructions: albumin 2.9   apixaban (ELIQUIS) 5 MG TABS tablet Take 1 tablet (5 mg total) by mouth 2 (two) times daily.   Calcium Carbonate (CALCIUM 500 PO) Take 1 tablet by mouth daily.   Cholecalciferol (D3 5000) 125 MCG (5000 UT) capsule Take 5,000 Units by mouth daily.   metoprolol succinate (TOPROL-XL) 25 MG 24 hr tablet Take 1 tablet (25 mg total) by mouth daily.   NON FORMULARY Diet:Regular   SYMBICORT 80-4.5 MCG/ACT inhaler Inhale 2 puffs into the lungs 2 (two) times daily.   vitamin B-12 (CYANOCOBALAMIN) 500 MCG tablet Take 500 mcg by mouth daily.   atorvastatin (LIPITOR) 80 MG tablet Take 1 tablet (80 mg total) by  mouth daily.   No facility-administered encounter medications on file as of 08/31/2021.     SIGNIFICANT DIAGNOSTIC EXAMS  PREVIOUS   07-17-21: chest x-ray: Diffuse mild interstitial opacity without focal airspace disease. This could indicate viral infection  07-21-21: chest x-ray: Increasing patchy opacity in the right lower lobe concerning for pneumonia.  NO NEW EXAMS.   LABS REVIEWED PREVIOUS   05-19-21: hgb a1c 5.8; chol  269; ldl 189; trig 88; hdl 62 07-17-21: wbc 15.9; hgb 8.8; hct 28.4; mcv 93.1 plt 301; glucose 121; bun 34; creat 1.11; k+ 4.0; na++ 137; ca 8.3; GFR 46; liver normal albumin 2.9 07-19-21: vit B 12: 1200; folate 15.3; iron 11; tibc 289; ferritin  100 10-Aug-2021: wbc 11.8; hgb 8.3; hct 27.0; mcv 91.5 plt 375; glucose 98; bun 26; creat 4.3; na++ 137; ca 8.3; GFR 56   NO NEW EXAMS.   Review of Systems  Unable to perform ROS: Dementia (unable to fully participate)    Physical Exam Constitutional:      General: She is not in acute distress.    Appearance: She is well-developed. She is not diaphoretic.  Neck:     Thyroid: No thyromegaly.  Cardiovascular:     Rate and Rhythm: Normal rate. Rhythm irregular.     Pulses: Normal pulses.     Heart sounds: Normal heart sounds.  Pulmonary:     Effort: Pulmonary effort is normal. No respiratory distress.     Breath sounds: Normal breath sounds.  Abdominal:     General: Bowel sounds are normal. There is no distension.     Palpations: Abdomen is soft.     Tenderness: There is no abdominal tenderness.  Musculoskeletal:     Cervical back: Neck supple.     Right lower leg: No edema.     Left lower leg: No edema.     Comments: Able to move all extremities     Lymphadenopathy:     Cervical: No cervical adenopathy.  Skin:    General: Skin is warm and dry.  Neurological:     Mental Status: She is alert. Mental status is at baseline.  Psychiatric:        Mood and Affect: Mood normal.      ASSESSMENT/ PLAN:  TODAY  1. Acute respiratory failure with hypoxia / asthma unspecified asthma severity unspecified whether complicated unspecified whether persistent: is stable will continue symbicort 80/4.5 mcg 2 puffs twice daily has albuterol 1-2 puffs every 6 hours as needed  2. Atrial fibrillation with RVR; heart rate is stable; remains irregular: will continue toprol xl 25 mg daily for rate controled; will continue eliquis 5 mg twice daily   3. Cerebral vascular accident (CVA) unspecified mechanism:is stable will continue eliquis 5 mg twice daily   PREVIOUS   4. Essential hypertension: is stable b/p 128/73 will continue toprol xl 25 mg daily   5. Dementia without behavioral disturbance:  weight is 186 pounds will monitor   6. Moderate protein calorie malnutrition: albumin 2.9. will continue supplements as directed and will begin prostat 56mL with meals.   7. Mixed hyperlipidemia: is stable due to advanced age: will stop lipitor at this time. Will monitor her status.     Synthia Innocent NP [facility]  Contact [phone] Monday through Friday 8am- 5pm  After hours call [phone]

## 2021-09-06 ENCOUNTER — Encounter: Payer: Self-pay | Admitting: Adult Health

## 2021-09-06 ENCOUNTER — Non-Acute Institutional Stay (SKILLED_NURSING_FACILITY): Payer: Medicare Other | Admitting: Adult Health

## 2021-09-06 ENCOUNTER — Other Ambulatory Visit (HOSPITAL_COMMUNITY)
Admission: RE | Admit: 2021-09-06 | Discharge: 2021-09-06 | Disposition: A | Discharge status: Home / Self Care | Payer: Medicare Other | Source: Skilled Nursing Facility | Attending: Adult Health | Admitting: Adult Health

## 2021-09-06 ENCOUNTER — Other Ambulatory Visit: Payer: Self-pay | Admitting: Adult Health

## 2021-09-06 DIAGNOSIS — I130 Hypertensive heart and chronic kidney disease with heart failure and stage 1 through stage 4 chronic kidney disease, or unspecified chronic kidney disease: Secondary | ICD-10-CM | POA: Diagnosis present

## 2021-09-06 DIAGNOSIS — I639 Cerebral infarction, unspecified: Secondary | ICD-10-CM | POA: Diagnosis not present

## 2021-09-06 LAB — CBC WITH DIFFERENTIAL/PLATELET
Abs Immature Granulocytes: 0.03 10*3/uL (ref 0.00–0.07)
Basophils Absolute: 0.1 10*3/uL (ref 0.0–0.1)
Basophils Relative: 1 %
Eosinophils Absolute: 0.5 10*3/uL (ref 0.0–0.5)
Eosinophils Relative: 6 %
HCT: 32.1 % — ABNORMAL LOW (ref 36.0–46.0)
Hemoglobin: 9.8 g/dL — ABNORMAL LOW (ref 12.0–15.0)
Immature Granulocytes: 0 %
Lymphocytes Relative: 41 %
Lymphs Abs: 4 10*3/uL (ref 0.7–4.0)
MCH: 26.5 pg (ref 26.0–34.0)
MCHC: 30.5 g/dL (ref 30.0–36.0)
MCV: 86.8 fL (ref 80.0–100.0)
Monocytes Absolute: 0.9 10*3/uL (ref 0.1–1.0)
Monocytes Relative: 9 %
Neutro Abs: 4.3 10*3/uL (ref 1.7–7.7)
Neutrophils Relative %: 43 %
Platelets: 313 10*3/uL (ref 150–400)
RBC: 3.7 MIL/uL — ABNORMAL LOW (ref 3.87–5.11)
RDW: 13.8 % (ref 11.5–15.5)
WBC: 9.8 10*3/uL (ref 4.0–10.5)
nRBC: 0 % (ref 0.0–0.2)

## 2021-09-06 LAB — COMPREHENSIVE METABOLIC PANEL
ALT: 10 U/L (ref 0–44)
AST: 12 U/L — ABNORMAL LOW (ref 15–41)
Albumin: 2.8 g/dL — ABNORMAL LOW (ref 3.5–5.0)
Alkaline Phosphatase: 64 U/L (ref 38–126)
Anion gap: 7 (ref 5–15)
BUN: 32 mg/dL — ABNORMAL HIGH (ref 8–23)
CO2: 27 mmol/L (ref 22–32)
Calcium: 8.7 mg/dL — ABNORMAL LOW (ref 8.9–10.3)
Chloride: 104 mmol/L (ref 98–111)
Creatinine, Ser: 1.05 mg/dL — ABNORMAL HIGH (ref 0.44–1.00)
GFR, Estimated: 49 mL/min — ABNORMAL LOW (ref >60)
Glucose, Bld: 88 mg/dL (ref 70–99)
Potassium: 4.1 mmol/L (ref 3.5–5.1)
Sodium: 138 mmol/L (ref 135–145)
Total Bilirubin: 0.3 mg/dL (ref 0.3–1.2)
Total Protein: 6.9 g/dL (ref 6.5–8.1)

## 2021-09-06 MED ORDER — LORAZEPAM 2 MG/ML PO CONC: 0.5000 mg | Freq: Four times a day (QID) | ORAL | 0 refills | Status: AC | PRN

## 2021-09-06 MED ORDER — MORPHINE SULFATE (CONCENTRATE) 20 MG/ML PO SOLN: 5.0000 mg | ORAL | 0 refills | Status: AC | PRN

## 2021-09-06 NOTE — Progress Notes (Signed)
Location:  [facility] Room Number: 140-W Place of Service:  SNF (31)   CODE STATUS: DNR  No Known Allergies  Chief Complaint  Patient presents with   Acute Visit    Change in status     HPI:  She has a change in her status. This AM she was found to be unresponsive to stimuli physical and verbal. Her respirations are even and non labored. She does snore at times. Her family is at bedside. This most likely represents an acute CVA. Her family would like some blood work to see if there is an infective process or an electrolyte disturbance. They do not want to have any further workup.   Past Medical History:  Diagnosis Date   Dementia (HCC)     History reviewed. No pertinent surgical history.  Social History   Socioeconomic History   Marital status: Widowed    Spouse name: Not on file   Number of children: Not on file   Years of education: Not on file   Highest education level: Not on file  Occupational History   Not on file  Tobacco Use   Smoking status: Former    Years: 20.00    Types: Cigarettes   Smokeless tobacco: Never  Vaping Use   Vaping Use: Never used  Substance and Sexual Activity   Alcohol use: Not Currently   Drug use: Not Currently   Sexual activity: Not Currently  Other Topics Concern   Not on file  Social History Narrative   Not on file   Social Determinants of Health   Financial Resource Strain: Not on file  Food Insecurity: Not on file  Transportation Needs: Not on file  Physical Activity: Not on file  Stress: Not on file  Social Connections: Not on file  Intimate Partner Violence: Not on file   History reviewed. No pertinent family history.    VITAL SIGNS BP 128/74   Pulse 84   Temp 97.7 F (36.5 C)   Resp 20   Ht 5\' 7"  (1.702 m)   Wt 174 lb 3.2 oz (79 kg)   SpO2 95%   BMI 27.28 kg/m   Outpatient Encounter Medications as of 09/06/2021  Medication Sig   LORazepam (LORAZEPAM INTENSOL) 2 MG/ML  concentrated solution Take 0.3 mLs (0.6 mg total) by mouth every 6 (six) hours as needed for anxiety.   morphine (ROXANOL) 20 MG/ML concentrated solution Take 0.25 mLs (5 mg total) by mouth every 2 (two) hours as needed for severe pain.   [DISCONTINUED] acetaminophen (TYLENOL) 500 MG tablet Take 500 mg by mouth every 6 (six) hours as needed for mild pain, fever or headache.   [DISCONTINUED] albuterol (VENTOLIN HFA) 108 (90 Base) MCG/ACT inhaler Inhale 1-2 puffs into the lungs every 6 (six) hours as needed for wheezing or shortness of breath.   [DISCONTINUED] Amino Acids-Protein Hydrolys (FEEDING SUPPLEMENT, PRO-STAT SUGAR FREE 64,) LIQD Take 30 mLs by mouth 3 (three) times daily with meals. Special Instructions: albumin 2.9   [DISCONTINUED] apixaban (ELIQUIS) 5 MG TABS tablet Take 1 tablet (5 mg total) by mouth 2 (two) times daily.   [DISCONTINUED] atorvastatin (LIPITOR) 80 MG tablet Take 1 tablet (80 mg total) by mouth daily.   [DISCONTINUED] Calcium Carbonate (CALCIUM 500 PO) Take 1 tablet by mouth daily.   [DISCONTINUED] Cholecalciferol (D3 5000) 125 MCG (5000 UT) capsule Take 5,000 Units by mouth daily.   [DISCONTINUED] LORazepam (ATIVAN) 2 MG/ML concentrated solution Take 0.5 mg by mouth every  6 (six) hours as needed for anxiety (and agitation). (Patient not taking: Reported on 09/06/2021)   [DISCONTINUED] metoprolol succinate (TOPROL-XL) 25 MG 24 hr tablet Take 1 tablet (25 mg total) by mouth daily.   [DISCONTINUED] morphine (ROXANOL) 20 MG/ML concentrated solution Take 5 mg by mouth every 2 (two) hours as needed for severe pain. (Patient not taking: Reported on 09/06/2021)   [DISCONTINUED] NON FORMULARY Diet:Regular   [DISCONTINUED] SYMBICORT 80-4.5 MCG/ACT inhaler Inhale 2 puffs into the lungs 2 (two) times daily.   [DISCONTINUED] vitamin B-12 (CYANOCOBALAMIN) 500 MCG tablet Take 500 mcg by mouth daily.   No facility-administered encounter medications on file as of 09/06/2021.      SIGNIFICANT DIAGNOSTIC EXAMS   PREVIOUS   07-17-21: chest x-ray: Diffuse mild interstitial opacity without focal airspace disease. This could indicate viral infection  07-21-21: chest x-ray: Increasing patchy opacity in the right lower lobe concerning for pneumonia.  NO NEW EXAMS.   LABS REVIEWED PREVIOUS   05-19-21: hgb a1c 5.8; chol  269; ldl 189; trig 88; hdl 62 07-17-21: wbc 15.9; hgb 8.8; hct 28.4; mcv 93.1 plt 301; glucose 121; bun 34; creat 1.11; k+ 4.0; na++ 137; ca 8.3; GFR 46; liver normal albumin 2.9 07-19-21: vit B 12: 1200; folate 15.3; iron 11; tibc 289; ferritin 100 07/29/2021: wbc 11.8; hgb 8.3; hct 27.0; mcv 91.5 plt 375; glucose 98; bun 26; creat 4.3; na++ 137; ca 8.3; GFR 56   NO NEW EXAMS.   Review of Systems  Unable to perform ROS: Medical condition   Physical Exam Constitutional:      General: She is not in acute distress.    Appearance: She is well-developed. She is not diaphoretic.  Neck:     Thyroid: No thyromegaly.  Cardiovascular:     Rate and Rhythm: Normal rate. Rhythm irregular.     Heart sounds: Normal heart sounds.  Pulmonary:     Effort: Pulmonary effort is normal. No respiratory distress.     Breath sounds: Normal breath sounds.  Abdominal:     General: Bowel sounds are normal. There is no distension.     Palpations: Abdomen is soft.     Tenderness: There is no abdominal tenderness.  Musculoskeletal:     Cervical back: Neck supple.     Right lower leg: No edema.     Left lower leg: No edema.     Comments: Does have some spastic movements to physical stimulation.   Lymphadenopathy:     Cervical: No cervical adenopathy.  Skin:    General: Skin is warm and dry.  Neurological:     Comments: Not aware of surroundings      ASSESSMENT/ PLAN:  TODAY  Acute CVA (cerebrovascular accident) Will stop medications Will start:  Roxanol 5 mg every 2 hours as needed Ativan solution 0.5 mg every 6 hours as needed    Ok Edwards  NP [facility]  Contact [phone] Monday through Friday 8am- 5pm  After hours call [phone]

## 2021-10-02 DEATH — deceased
